# Patient Record
Sex: Female | Born: 1950 | Race: Black or African American | Hispanic: No | Marital: Single | Smoking: Former smoker
Health system: Southern US, Community
[De-identification: ages and names within clinical notes are randomized; demographics above are authoritative.]

## PROBLEM LIST (undated history)

## (undated) DIAGNOSIS — K259 Gastric ulcer, unspecified as acute or chronic, without hemorrhage or perforation: Secondary | ICD-10-CM

## (undated) DIAGNOSIS — D219 Benign neoplasm of connective and other soft tissue, unspecified: Secondary | ICD-10-CM

## (undated) DIAGNOSIS — K5790 Diverticulosis of intestine, part unspecified, without perforation or abscess without bleeding: Secondary | ICD-10-CM

## (undated) DIAGNOSIS — K219 Gastro-esophageal reflux disease without esophagitis: Secondary | ICD-10-CM

## (undated) DIAGNOSIS — K648 Other hemorrhoids: Secondary | ICD-10-CM

## (undated) DIAGNOSIS — IMO0001 Reserved for inherently not codable concepts without codable children: Secondary | ICD-10-CM

## (undated) DIAGNOSIS — E559 Vitamin D deficiency, unspecified: Secondary | ICD-10-CM

## (undated) DIAGNOSIS — R519 Headache, unspecified: Secondary | ICD-10-CM

## (undated) DIAGNOSIS — R51 Headache: Secondary | ICD-10-CM

## (undated) DIAGNOSIS — Z5189 Encounter for other specified aftercare: Secondary | ICD-10-CM

## (undated) HISTORY — DX: Gastric ulcer, unspecified as acute or chronic, without hemorrhage or perforation: K25.9

## (undated) HISTORY — DX: Vitamin D deficiency, unspecified: E55.9

## (undated) HISTORY — DX: Diverticulosis of intestine, part unspecified, without perforation or abscess without bleeding: K57.90

## (undated) HISTORY — DX: Encounter for other specified aftercare: Z51.89

## (undated) HISTORY — DX: Other hemorrhoids: K64.8

## (undated) HISTORY — PX: MYOMECTOMY: SHX85

## (undated) HISTORY — PX: UPPER GASTROINTESTINAL ENDOSCOPY: SHX188

## (undated) HISTORY — DX: Headache: R51

## (undated) HISTORY — DX: Reserved for inherently not codable concepts without codable children: IMO0001

## (undated) HISTORY — DX: Gastro-esophageal reflux disease without esophagitis: K21.9

## (undated) HISTORY — DX: Benign neoplasm of connective and other soft tissue, unspecified: D21.9

## (undated) HISTORY — PX: POLYPECTOMY: SHX149

## (undated) HISTORY — DX: Headache, unspecified: R51.9

## (undated) HISTORY — PX: ROTATOR CUFF REPAIR: SHX139

---

## 1997-07-25 ENCOUNTER — Ambulatory Visit (HOSPITAL_COMMUNITY): Admission: RE | Admit: 1997-07-25 | Discharge: 1997-07-25 | Payer: Self-pay | Admitting: Obstetrics and Gynecology

## 1997-10-18 ENCOUNTER — Emergency Department (HOSPITAL_COMMUNITY): Admission: EM | Admit: 1997-10-18 | Discharge: 1997-10-18 | Payer: Self-pay | Admitting: *Deleted

## 1998-03-30 ENCOUNTER — Emergency Department (HOSPITAL_COMMUNITY): Admission: EM | Admit: 1998-03-30 | Discharge: 1998-03-30 | Payer: Self-pay | Admitting: Emergency Medicine

## 1998-03-30 ENCOUNTER — Encounter: Payer: Self-pay | Admitting: Emergency Medicine

## 1998-07-06 ENCOUNTER — Other Ambulatory Visit: Admission: RE | Admit: 1998-07-06 | Discharge: 1998-07-06 | Payer: Self-pay | Admitting: Obstetrics and Gynecology

## 1999-08-28 ENCOUNTER — Encounter: Payer: Self-pay | Admitting: Family Medicine

## 1999-08-28 ENCOUNTER — Ambulatory Visit (HOSPITAL_COMMUNITY): Admission: RE | Admit: 1999-08-28 | Discharge: 1999-08-28 | Payer: Self-pay | Admitting: Family Medicine

## 1999-10-17 ENCOUNTER — Ambulatory Visit (HOSPITAL_COMMUNITY): Admission: RE | Admit: 1999-10-17 | Discharge: 1999-10-17 | Payer: Self-pay | Admitting: Family Medicine

## 1999-10-17 ENCOUNTER — Encounter: Payer: Self-pay | Admitting: Family Medicine

## 2000-04-23 ENCOUNTER — Other Ambulatory Visit: Admission: RE | Admit: 2000-04-23 | Discharge: 2000-04-23 | Payer: Self-pay | Admitting: Obstetrics and Gynecology

## 2001-06-17 ENCOUNTER — Other Ambulatory Visit: Admission: RE | Admit: 2001-06-17 | Discharge: 2001-06-17 | Payer: Self-pay | Admitting: Obstetrics and Gynecology

## 2001-09-21 ENCOUNTER — Encounter: Payer: Self-pay | Admitting: Family Medicine

## 2001-09-21 ENCOUNTER — Encounter: Admission: RE | Admit: 2001-09-21 | Discharge: 2001-09-21 | Payer: Self-pay | Admitting: Family Medicine

## 2002-07-14 ENCOUNTER — Other Ambulatory Visit: Admission: RE | Admit: 2002-07-14 | Discharge: 2002-07-14 | Payer: Self-pay | Admitting: Obstetrics and Gynecology

## 2002-12-22 ENCOUNTER — Encounter: Admission: RE | Admit: 2002-12-22 | Discharge: 2003-03-22 | Payer: Self-pay | Admitting: Family Medicine

## 2003-08-04 ENCOUNTER — Other Ambulatory Visit: Admission: RE | Admit: 2003-08-04 | Discharge: 2003-08-04 | Payer: Self-pay | Admitting: Obstetrics and Gynecology

## 2004-07-18 ENCOUNTER — Ambulatory Visit: Payer: Self-pay

## 2004-08-14 ENCOUNTER — Other Ambulatory Visit: Admission: RE | Admit: 2004-08-14 | Discharge: 2004-08-14 | Payer: Self-pay | Admitting: Obstetrics and Gynecology

## 2005-08-21 ENCOUNTER — Other Ambulatory Visit: Admission: RE | Admit: 2005-08-21 | Discharge: 2005-08-21 | Payer: Self-pay | Admitting: Obstetrics and Gynecology

## 2005-10-01 ENCOUNTER — Ambulatory Visit: Payer: Self-pay | Admitting: Gastroenterology

## 2005-10-17 ENCOUNTER — Ambulatory Visit: Payer: Self-pay | Admitting: Gastroenterology

## 2005-10-17 ENCOUNTER — Encounter (INDEPENDENT_AMBULATORY_CARE_PROVIDER_SITE_OTHER): Payer: Self-pay | Admitting: *Deleted

## 2006-10-07 ENCOUNTER — Encounter: Payer: Self-pay | Admitting: Internal Medicine

## 2006-10-07 ENCOUNTER — Inpatient Hospital Stay (HOSPITAL_COMMUNITY): Admission: EM | Admit: 2006-10-07 | Discharge: 2006-10-09 | Payer: Self-pay | Admitting: Emergency Medicine

## 2006-10-14 ENCOUNTER — Ambulatory Visit: Payer: Self-pay | Admitting: Internal Medicine

## 2006-11-14 ENCOUNTER — Ambulatory Visit: Payer: Self-pay | Admitting: Internal Medicine

## 2006-11-14 LAB — CONVERTED CEMR LAB
Basophils Absolute: 0 10*3/uL (ref 0.0–0.1)
Basophils Relative: 0 % (ref 0.0–1.0)
Eosinophils Absolute: 0.3 10*3/uL (ref 0.0–0.6)
Eosinophils Relative: 4.7 % (ref 0.0–5.0)
HCT: 31.9 % — ABNORMAL LOW (ref 36.0–46.0)
Hemoglobin: 11 g/dL — ABNORMAL LOW (ref 12.0–15.0)
Lymphocytes Relative: 26.6 % (ref 12.0–46.0)
MCHC: 34.4 g/dL (ref 30.0–36.0)
MCV: 88.3 fL (ref 78.0–100.0)
Monocytes Absolute: 0.4 10*3/uL (ref 0.2–0.7)
Monocytes Relative: 6.5 % (ref 3.0–11.0)
Neutro Abs: 4.3 10*3/uL (ref 1.4–7.7)
Neutrophils Relative %: 62.2 % (ref 43.0–77.0)
Platelets: 243 10*3/uL (ref 150–400)
RBC: 3.62 M/uL — ABNORMAL LOW (ref 3.87–5.11)
RDW: 15.3 % — ABNORMAL HIGH (ref 11.5–14.6)
WBC: 6.8 10*3/uL (ref 4.5–10.5)

## 2007-10-16 DIAGNOSIS — K219 Gastro-esophageal reflux disease without esophagitis: Secondary | ICD-10-CM | POA: Insufficient documentation

## 2007-10-16 DIAGNOSIS — K253 Acute gastric ulcer without hemorrhage or perforation: Secondary | ICD-10-CM | POA: Insufficient documentation

## 2007-10-16 DIAGNOSIS — D126 Benign neoplasm of colon, unspecified: Secondary | ICD-10-CM

## 2007-10-16 DIAGNOSIS — R000 Tachycardia, unspecified: Secondary | ICD-10-CM

## 2007-10-16 DIAGNOSIS — K5730 Diverticulosis of large intestine without perforation or abscess without bleeding: Secondary | ICD-10-CM | POA: Insufficient documentation

## 2007-10-16 DIAGNOSIS — D62 Acute posthemorrhagic anemia: Secondary | ICD-10-CM

## 2010-06-20 ENCOUNTER — Ambulatory Visit
Admission: RE | Admit: 2010-06-20 | Discharge: 2010-06-20 | Payer: Self-pay | Source: Home / Self Care | Attending: Family Medicine | Admitting: Family Medicine

## 2010-06-20 DIAGNOSIS — J309 Allergic rhinitis, unspecified: Secondary | ICD-10-CM | POA: Insufficient documentation

## 2010-06-24 ENCOUNTER — Telehealth: Payer: Self-pay | Admitting: Family Medicine

## 2010-07-05 NOTE — Progress Notes (Signed)
  Phone Note Call from Patient   Caller: Patient Reason for Call: Talk to Nurse Summary of Call: Patient came in on Wednesday to see Dr. Laural Benes.  Dr. Laural Benes prescribed the patient anitbiotic but the medication is upsetting her stomach.  She would like to know if the doctor could prescribe her something else.  You can call the patient at [phone]. Initial call taken by: Dorna Leitz,  June 24, 2010 1:45 PM  Follow-up for Phone Call        patient was precribed doxycycline Follow-up by: Adella Hare LPN,  June 24, 2010 1:54 PM  Additional Follow-up for Phone Call Additional follow up Details #1::        Please have patient to discontinue the doxycycline and will prescribe azithromycin ... See below.  Additional Follow-up by: Standley Dakins MD,  June 24, 2010 2:47 PM   New Allergies: ! ASA Additional Follow-up for Phone Call Additional follow up Details #2::    RETURNED CALL, LEFT MESSAGE Follow-up by: Adella Hare LPN,  June 24, 2010 3:39 PM  Additional Follow-up for Phone Call Additional follow up Details #3:: Details for Additional Follow-up Action Taken: patient aware and med sent to pharmacy patient requested Additional Follow-up by: Adella Hare LPN,  June 24, 2010 4:25 PM  New/Updated Medications: AZITHROMYCIN 250 MG TABS (AZITHROMYCIN) take 2 tabs by mouth on day 1, then 1 tab by mouth daily until completed New Allergies: ! ASAPrescriptions: AZITHROMYCIN 250 MG TABS (AZITHROMYCIN) take 2 tabs by mouth on day 1, then 1 tab by mouth daily until completed  #6 x 0   Entered by:   Adella Hare LPN   Authorized by:   Standley Dakins MD   Signed by:   Adella Hare LPN on 84/69/6295   Method used:   Electronically to        [facility]. [phone]* (retail)       [address]       [place], [place]  [address]       Ph: [phone]       Fax: [phone]   RxID:   [id number]

## 2010-07-05 NOTE — Assessment & Plan Note (Signed)
Summary: PAIN ON THE LEFT SIDE OF FACE AROUND EYE AND NOSE  (HX OF MIG...   Vital Signs:  Patient Profile:   60 Years Old Female CC:      Pain in the left side of Nose Height:     62 inches Weight:      180 pounds BMI:     33.04 O2 Sat:      96 % O2 treatment:    Room Air Temp:     97.3 degrees F oral Pulse rate:   75 / minute Pulse rhythm:   regular Resp:     18 per minute BP sitting:   123 / 79  (right arm)  Pt. in pain?   yes    Location:   head    Intensity:   8    Type:       aching  Vitals Entered By: Levonne Spiller EMT-P (June 20, 2010 11:53 AM)              Is Patient Diabetic? Yes   Does patient need assistance? Functional Status Self care Ambulation Normal      Current Allergies: ! * BENEDRYL ! * SHELL FISHHistory of Present Illness History from: patient Reason for visit: see chief complaint Chief Complaint: Pain in the left side of Nose History of Present Illness: This patient is presenting today because she reports that for the past several days she's been experiencing a worsening bad taste in the mouth. She reports that her symptoms started approximately 3 weeks ago and have not improved. She reports that her blood sugars have been higher in the last week. She's having sinus pain. She's also having exacerbation of ulcers in the mouth from poor dentition. The patient reports that she's having no fever but reports that she has been having allergy symptoms recently and does have a history of migraine headaches and pollen allergies. She denies having any nausea vomiting but reports that approximately 2 weeks ago she had bouts of hard stools and abdominal pain. The pain resolved when she was able to have a full bowel movement.  REVIEW OF SYSTEMS Constitutional Symptoms      Denies fever, chills, night sweats, weight loss, weight gain, and fatigue.  Eyes       Denies change in vision, eye pain, eye discharge, glasses, contact lenses, and eye  surgery. Ear/Nose/Throat/Mouth       Complains of frequent runny nose and sinus problems.      Denies hearing loss/aids, change in hearing, ear pain, ear discharge, dizziness, frequent nose bleeds, sore throat, hoarseness, and tooth pain or bleeding.  Respiratory       Denies dry cough, productive cough, wheezing, shortness of breath, asthma, bronchitis, and emphysema/COPD.  Cardiovascular       Denies murmurs, chest pain, and tires easily with exhertion.    Gastrointestinal       Denies stomach pain, nausea/vomiting, diarrhea, constipation, blood in bowel movements, and indigestion. Genitourniary       Denies painful urination, blood or discharge from vagina, kidney stones, and loss of urinary control. Neurological       Complains of headaches.      Denies paralysis, seizures, and fainting/blackouts. Musculoskeletal       Denies muscle pain, joint pain, joint stiffness, decreased range of motion, redness, swelling, muscle weakness, and gout.  Skin       Denies bruising, unusual mles/lumps or sores, and hair/skin or nail changes.  Psych  Denies mood changes, temper/anger issues, anxiety/stress, speech problems, depression, and sleep problems. Other Comments: Pt. complains of pain on the left side of her nose.   Past History:  Past Surgical History: Last updated: 10/16/2007 Myomectomy secondary to fibroids  Family History: Last updated: 06/20/2010 Both parents reported as deceased per patient. No other conditions reported.   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   Past Medical History: Current Problems:  ANEMIA, SECONDARY TO ACUTE BLOOD LOSS (ICD-285.1) Hx of TACHYCARDIA (ICD-785.0) ACID REFLUX DISEASE (ICD-530.81) ACUT GASTR ULCER W/O MENTION HEMORR PERF/OBST (ICD-531.30) Allergic rhinitis  Family History: Both parents reported as deceased per patient.  No other conditions reported.   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.  Physical Exam General appearance: well developed, well nourished, no acute distress Head: normocephalic, atraumatic Eyes: conjunctivae and lids normal Pupils: equal, round, reactive to light Ears: normal, no lesions or deformities Nasal: marked sinus and nasal congestion Oral/Pharynx: tongue normal, posterior pharynx without erythema or exudate, poor dentition and apthous ulcers on bridge of gums Neck: neck supple,  trachea midline, no masses Chest/Lungs: no rales, wheezes, or rhonchi bilateral, breath sounds equal without effort Heart: regular rate and  rhythm, no murmur Abdomen: soft, non-tender without obvious organomegaly Extremities: normal extremities Neurological: grossly intact and non-focal Skin: no obvious rashes or lesions MSE: oriented to time, place, and person Assessment  Assessed ALLERGIC RHINITIS as deteriorated - Standley Dakins MD New Problems: ACUTE SINUSITIS, UNSPECIFIED (ICD-461.9) APHTHOUS STOMATITIS (ICD-528.2) ALLERGIC RHINITIS (ICD-477.9)   Patient Education: Patient and/or caregiver instructed in the following: rest, fluids. The risks, benefits and possible side effects were clearly explained and discussed with the patient.  The patient verbalized clear understanding.  The patient was given instructions to return if symptoms don't improve, worsen or new changes develop.  If it is not during clinic hours and the patient cannot get back to this clinic then the patient was told to seek medical care at an available urgent care or emergency department.  The patient verbalized understanding.   Demonstrates willingness to comply.  Plan New Medications/Changes: LORATADINE 10 MG TABS (LORATADINE) take 1 by mouth daily for allergies  #14 x 0, 06/20/2010, Clanford Johnson MD LIDOCAINE VISCOUS 2 % SOLN  (LIDOCAINE HCL) 10 mL by mouth swish and spit out every 3 hours as needed for mouth ulcers; MAX 8 doses per day  #100 mL x 0, 06/20/2010, Clanford Johnson MD FLUTICASONE PROPIONATE 50 MCG/ACT SUSP (FLUTICASONE PROPIONATE) 2 sprays per nostril once daily  #1 x 0, 06/20/2010, Clanford Johnson MD DOXYCYCLINE HYCLATE 100 MG CAPS (DOXYCYCLINE HYCLATE) take 1 by mouth two times a day with food until completed  #20 x 0, 06/20/2010, Clanford Johnson MD  Planning Comments:   If no improvement, pt advised to call for ENT referral. The patient verbalized clear understanding. Pt was advised to follow up with dentist asap regarding apthous ulcers and will try using viscous lidocaine for symptoms as needed.  I advised the patient to be extra cautious with using viscous lidocaine and to be sure to avoid swallowing the medication. The patient verbalized understanding.   Follow Up: Follow up in 2-3 days if no improvement, Follow up on an as needed basis, Follow up with Primary Physician  The patient and/or caregiver has been counseled thoroughly with regard to medications prescribed  including dosage, schedule, interactions, rationale for use, and possible side effects and they verbalize understanding.  Diagnoses and expected course of recovery discussed and will return if not improved as expected or if the condition worsens. Patient and/or caregiver verbalized understanding.  Prescriptions: LORATADINE 10 MG TABS (LORATADINE) take 1 by mouth daily for allergies  #14 x 0   Entered and Authorized by:   Standley Dakins MD   Signed by:   Standley Dakins MD on 06/20/2010   Method used:   Electronically to        [facility] [address] (retail)       [address], [address]       [place], [place]  [address]       Ph: [phone]       Fax: [phone]   RxID:   [id number] LIDOCAINE VISCOUS 2 % SOLN (LIDOCAINE HCL) 10 mL by mouth swish and spit out every 3 hours as needed for  mouth ulcers; MAX 8 doses per day  #100 mL x 0   Entered and Authorized by:   Standley Dakins MD   Signed by:   Standley Dakins MD on 06/20/2010   Method used:   Electronically to        [facility] [address] (retail)       [address], [address]       [place], [place]  [address]       Ph: [phone]       Fax: [phone]   RxID:   [id number] FLUTICASONE PROPIONATE 50 MCG/ACT SUSP (FLUTICASONE PROPIONATE) 2 sprays per nostril once daily  #1 x 0   Entered and Authorized by:   Standley Dakins MD   Signed by:   Standley Dakins MD on 06/20/2010   Method used:   Electronically to        [facility] (retail)       [address], [address]       [place], [place]  [address]       Ph: [phone]       Fax: [phone]   RxID:   [id number] DOXYCYCLINE HYCLATE 100 MG CAPS (DOXYCYCLINE HYCLATE) take 1 by mouth two times a day with food until completed  #20 x 0   Entered and Authorized by:   Standley Dakins MD   Signed by:   Standley Dakins MD on 06/20/2010   Method used:   Electronically to        [facility] (retail)       [address], [address]       [place], [place]  [address]       Ph: [phone]       Fax: [phone]   RxID:   [id number]   Patient Instructions: 1)  Oral Rehydration Solution: drink 1/2 ounce every 15 minutes. If tolerated afert 1 hour, drink 1 ounce every 15 minutes. As you can tolerate, keep adding 1/2 ounce every 15 minutes, up to a total of 2-4 ounces. Contact the office if unable to tolerate oral solution, if you keep vomiting, or you continue to have signs of dehydration. 2)  Take your antibiotic as prescribed until ALL of it is gone, but stop if you develop a rash or swelling  and contact our office as soon as possible. 3)  Acute sinusitis symptoms for less than 10 days are not helped by antibiotics.Use warm moist  compresses, and over the counter decongestants ( only as directed). Call if no improvement in 5-7 days, sooner if increasing pain, fever, or new symptoms. 4)  Please see her dentist as soon as possible to have the abscess ulcers evaluated and to have dentures fitted. 5)  Return or go to the ER if no improvement or symptoms getting worse.   6)  The patient was informed that there is no on-call provider or services available at this clinic during off-hours (when the clinic is closed).  If the patient developed a problem or concern that required immediate attention, the patient was advised to go the the nearest available urgent care or emergency department for medical care.  The patient verbalized understanding.

## 2010-10-16 NOTE — Assessment & Plan Note (Signed)
[facility]                         GASTROENTEROLOGY OFFICE NOTE   ENNIFER, HARSTON                       MRN:          [id number]  DATE:11/14/2006                            DOB:          [birth year]    Ms. Bohnet is a 60 year old African-American female who was recently  hospitalized at [facility] with acute upper GI bleed, upper  endoscopy which on 10/07/06 showed active gastric ulcers in thegastric  antrum. It was H. pylori negative. The patient was treated with  intravenous proton pump inhibitor__________ and was discharged on  Protonix 40 mg twice daily. Shehas decreased it to once daily. She is  also on iron supplement and is feeling fine. She had some dark stools  due to taking oral iron, but denies abdominal pain, nausea, or vomiting.  She has discontinued the use of her non-steroidal agents. She is now  taking aspirin for headache without any improvement of her migraine  headaches.   PHYSICAL EXAMINATION:  VITAL SIGNS:  Blood pressure 118/80, pulse 72,  weight 201 pounds.  GENERAL:  She was moderately overweight, alert, and oriented.  LUNGS:  Clear to auscultation.  CARDIAC:  Normal S1, normal S2.  ABDOMEN:  Obese, soft and nontender, with normal active bowel sounds.  RECTAL:  Shows dark hemoccult negative stool.   IMPRESSION:  This is a 60 year old African-American female  with__________ status post upper GI bleed on 10/07/06. She is doing well  mostly due to discontinuation of her NSAIDs and due to taking her PPIs.   PLAN:  1. Continue Protonix 40 mg daily.  2. Re-endoscopy.  3. Continue to hold off on NSAIDs.  4. Instead of__________ try Fioricet every 4-6 hours p.r.n. headache.      Today, we will check her CBC.     Hedwig Morton. Juanda Chance, MD  Electronically Signed    DMB/MedQ  DD: 11/14/2006  DT: 11/15/2006  Job #: [id number]   cc:   Lacretia Leigh. Quintella Reichert, M.D.  Michaelyn Barter, M.D.

## 2010-10-19 NOTE — H&P (Signed)
NAME:  Jean Krause, Jean Krause NO.:  [id number]   MEDICAL RECORD NO.:  [id number]          PATIENT TYPE:  EMS   LOCATION:  ED                           FACILITY:  [facility]   PHYSICIAN:  Michaelyn Barter, M.D. DATE OF BIRTH:  September 23, 1950   DATE OF ADMISSION:  10/06/2006  DATE OF DISCHARGE:                              HISTORY & PHYSICAL   PRIMARY CARE PHYSICIAN:  Unassigned.   GASTROENTEROLOGIST:  [place].   CHIEF COMPLAINT:  Dark stools.   HISTORY OF PRESENT ILLNESS:  Jean Krause is a 60 year old female who  states that this past Saturday, she felt weak and dizzy, that is, only  four days ago.  She went to her urgent care medical center to be  evaluated.  She was told that her blood level was low and that her  hemoglobin was 9 point something.  Her stools were dark at that  particular time.  This was attributed to the Pepto-Bismol that she was  taking.  She was started on iron tablets; however, her symptoms did not  improve.  Today, she states that she experienced some shortness of  breath accompanied by tachycardia and diaphoresis.  As she walked the  short distance from the store to her car and then later, as she walked  carrying a bag of groceries from the car to the inside of her house.  She therefore came to the hospital for evaluation.  She states that she  felt severely weak over the past couple of days.  She has also had some  slight headaches but denies any nausea, vomiting, fevers, or chills.  She has never had any similar symptoms before.  She states that she has  been taking Excedrin for headache.   PAST MEDICAL HISTORY:  1. Acid reflux.  2. Headaches.  3. Colonoscopy done in 2007.  It was positive for polyps.  She sees      [facility].   PAST SURGICAL HISTORY:  Myomectomy secondary to fibroids.   ALLERGIES:  BENADRYL causes a rash.   CURRENT MEDICATIONS:  Patient is not sure.   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  Cigarettes:  Patient stopped smoking 20 years ago.  She  was 60 years old when she started.  Alcohol occasionally.   FAMILY HISTORY:  Mother:  No illnesses.  Father:  Cancer, type is  unknown.   REVIEW OF SYSTEMS:  As per HPI.   PHYSICAL EXAMINATION:  GENERAL:  The patient is awake, cooperative, no  obvious distress.  VITAL SIGNS:  Temperature 97, blood pressure 105/64, heart rate 107,  respirations 20, O2 sat 100% on room air.  HEENT:  Atraumatic.  Anicteric.  Extraocular movements are intact.  Pupils are equal and reactive to light.  Oral mucosa is pink.  No  thrush.  No exudates.  NECK:  Supple.  No lymphadenopathy.  Thyroid not palpable.  CARDIAC:  S1 and S2 present.  Regular rate and rhythm.  RESPIRATORY:  No crackles or wheezes.  ABDOMEN:  Soft, nontender, nondistended.  Positive bowel sounds.  No  masses.  EXTREMITIES:  No leg edema.  NEUROLOGIC:  Patient is alert and oriented x3.  MUSCULOSKELETAL:  Upper and lower extremity strength 5/5.   LABS:  White blood cell count 9.9, hemoglobin 6.8, hematocrit 20.2,  platelets 225, PT 14.2, INR 1.1, PTT 25.  Sodium 141, potassium 3.9,  chloride 108, CO2 25, BUN 26, creatinine 0.89, glucose 132, calcium 8.7.  CK-MB, TLC less than 1.  Troponin I, TLC less than 0.05.  Myoglobin, TLC  30.2.   ASSESSMENT/PLAN:  1. Symptomatic acute blood loss anemia:  The etiology of this may be      gastrointestinal-related, possibly upper gastrointestinal, given      the fact that the patient does complain of black stools, although      there is some questionable relationship to the patient's iron      and/or Pepto-Bismol that she was taking previously.  Will start the      patient on Protonix.  Will transfuse 3 units of packed red blood      cells.  Will monitor her H&H closely, and consult gastroenterology.      The patient is most likely to need an EGD and/or colonoscopy.  2. Tachycardia:  This is most likely related to #1.  Will  transfuse      the patient, check an EKG as well as cardiac enzymes x3 q.8h.      apart.      Michaelyn Barter, M.D.  Electronically Signed     OR/MEDQ  D:  10/07/2006  T:  10/07/2006  Job:  [id number]

## 2010-10-19 NOTE — Discharge Summary (Signed)
NAME:  Jean Krause, Jean Krause              ACCOUNT NO.:  [id number]   MEDICAL RECORD NO.:  [id number]          PATIENT TYPE:  INP   LOCATION:  1427                         FACILITY:  [facility]   PHYSICIAN:  Thomasenia Bottoms, MDDATE OF BIRTH:  [birth year]   DATE OF ADMISSION:  10/06/2006  DATE OF DISCHARGE:  10/09/2006                               DISCHARGE SUMMARY   Please see H&P dictated on Oct 07, 2006 for admission ETS.   BRIEF SUMMARY OF HOSPITAL COURSE:  [name] is a 60 year old woman,  who presented to the hospital with melena and symptomatic anemia.  She  was found to have a hemoglobin of 6.8 on arrival.  The patient was  transfused, her melena resolved.  She was seen in consultation by  [facility] gastroenterology.  She was found to have multiple small  prepyloric ulcers felt to be NSAID induced.  None were actively bleeding  and she did not require intervention.  The patient was placed on PPIs  and did very well during this hospital stay.  By the morning of  discharge, she is eating without difficulty.  Her hemoglobin on the  morning of discharge is 9.5 and has been stable there.  The patient was  taking NSAIDs for her occasional migraine headaches.  As an alternative,  she has been prescribed Egic which does not contain any NSAIDs.  I  personally discussed the issue of NSAIDs at length with her.  We also  discussed iron supplementation.  The patient actually had already gotten  a prescription for Niferex, which he will continue to take.   DISCHARGE DIAGNOSES:  1. Acute gastrointestinal bleed.  2. Multiple prepyloric and gastric ulcers by      esophagogastroduodenoscopy.  3. Posthemorrhagic anemia, now stable.  4. Migraine headaches.  5. She has a history of colonoscopy in 2007 by [facility] with polyps.  6. History of myomectomy secondary to fibroids.   MEDICATIONS ON DISCHARGE INCLUDE:  1. Protonix 40 mg one tablet b.i.d. for eight weeks and one tablet      every morning.  2. Esgic #50 one q.4 h. p.r.n. migraine headache.  3. Niferex 150 mg one tablet daily for iron deficiency.   FOLLOWUP INSTRUCTIONS:  The patient is to follow up with Dr. Juanda Chance, in  the office, on June 13 at 2 p.m. the patient currently does not have a  primary care physician; several primary doctors were listed with clinic  numbers in the patient is to follow up with one of them.      Thomasenia Bottoms, MD  Electronically Signed     CVC/MEDQ  D:  10/09/2006  T:  10/09/2006  Job:  [id number]

## 2010-11-06 ENCOUNTER — Other Ambulatory Visit: Payer: Self-pay | Admitting: Internal Medicine

## 2011-01-04 ENCOUNTER — Encounter: Payer: Self-pay | Admitting: Internal Medicine

## 2011-01-15 ENCOUNTER — Encounter: Payer: Self-pay | Admitting: Internal Medicine

## 2011-01-24 ENCOUNTER — Other Ambulatory Visit: Payer: Self-pay | Admitting: Internal Medicine

## 2011-02-06 ENCOUNTER — Ambulatory Visit (AMBULATORY_SURGERY_CENTER): Payer: Federal, State, Local not specified - PPO | Admitting: *Deleted

## 2011-02-06 ENCOUNTER — Encounter: Payer: Self-pay | Admitting: Internal Medicine

## 2011-02-06 VITALS — Ht 62.0 in | Wt 181.0 lb

## 2011-02-06 DIAGNOSIS — Z860100 Personal history of colon polyps, unspecified: Secondary | ICD-10-CM

## 2011-02-06 MED ORDER — SUPREP BOWEL PREP KIT 17.5-3.13-1.6 GM/177ML PO SOLN: 1.0000 | Freq: Once | ORAL | Status: DC

## 2011-02-06 MED ORDER — MOVIPREP 100 G PO SOLR: ORAL | Status: DC

## 2011-02-19 ENCOUNTER — Other Ambulatory Visit: Payer: Self-pay | Admitting: Internal Medicine

## 2011-02-22 ENCOUNTER — Telehealth: Payer: Self-pay | Admitting: Internal Medicine

## 2011-02-22 NOTE — Telephone Encounter (Signed)
CALLED PATIENT,NO ANSWER LEFT MESSAGE FOR PATIENT TO CALL us BACK.

## 2011-02-28 ENCOUNTER — Telehealth: Payer: Self-pay | Admitting: Internal Medicine

## 2011-02-28 NOTE — Telephone Encounter (Signed)
Offered pt rebate for MoviPrep.  Agrees to use MoviPrep. Jean Krause

## 2011-03-04 ENCOUNTER — Ambulatory Visit (AMBULATORY_SURGERY_CENTER): Payer: Federal, State, Local not specified - PPO | Admitting: Internal Medicine

## 2011-03-04 ENCOUNTER — Encounter: Payer: Self-pay | Admitting: Internal Medicine

## 2011-03-04 DIAGNOSIS — Z86010 Personal history of colonic polyps: Secondary | ICD-10-CM

## 2011-03-04 DIAGNOSIS — Z1211 Encounter for screening for malignant neoplasm of colon: Secondary | ICD-10-CM

## 2011-03-04 MED ORDER — SODIUM CHLORIDE 0.9 % IV SOLN: 500.0000 mL | INTRAVENOUS | Status: DC

## 2011-03-04 NOTE — Patient Instructions (Signed)
Discharge instructions given with verbal understanding. Handouts on diverticulosis and hemorrhoids given. Resume previous medications. 

## 2011-03-05 ENCOUNTER — Telehealth: Payer: Self-pay | Admitting: *Deleted

## 2011-03-05 NOTE — Telephone Encounter (Signed)
Voicemail no I.D. NO Message left.

## 2011-08-14 ENCOUNTER — Ambulatory Visit (INDEPENDENT_AMBULATORY_CARE_PROVIDER_SITE_OTHER): Payer: Federal, State, Local not specified - PPO | Admitting: Obstetrics and Gynecology

## 2011-08-14 DIAGNOSIS — Z01419 Encounter for gynecological examination (general) (routine) without abnormal findings: Secondary | ICD-10-CM

## 2011-11-15 ENCOUNTER — Ambulatory Visit (INDEPENDENT_AMBULATORY_CARE_PROVIDER_SITE_OTHER): Payer: Federal, State, Local not specified - PPO | Admitting: Family Medicine

## 2011-11-15 VITALS — BP 109/71 | HR 72 | Temp 98.2°F | Resp 16 | Ht 61.0 in | Wt 183.0 lb

## 2011-11-15 DIAGNOSIS — R1013 Epigastric pain: Secondary | ICD-10-CM

## 2011-11-15 DIAGNOSIS — K219 Gastro-esophageal reflux disease without esophagitis: Secondary | ICD-10-CM

## 2011-11-15 LAB — POCT CBC
HCT, POC: 36.1 % — AB (ref 37.7–47.9)
Hemoglobin: 11.6 g/dL — AB (ref 12.2–16.2)
Lymph, poc: 2.7 (ref 0.6–3.4)
MCH, POC: 29.3 pg (ref 27–31.2)
MCHC: 32.1 g/dL (ref 31.8–35.4)
MCV: 91.1 fL (ref 80–97)
MPV: 6.4 fL (ref 0–99.8)
POC MID %: 6.1 %M (ref 0–12)
WBC: 7.7 10*3/uL (ref 4.6–10.2)

## 2011-11-15 MED ORDER — GI COCKTAIL ~~LOC~~: 30.0000 mL | Freq: Once | ORAL | Status: DC

## 2011-11-15 MED ORDER — SUCRALFATE 1 G PO TABS: 1.0000 g | ORAL_TABLET | Freq: Four times a day (QID) | ORAL | Status: DC

## 2011-11-15 MED ORDER — PANTOPRAZOLE SODIUM 40 MG PO TBEC: 40.0000 mg | DELAYED_RELEASE_TABLET | Freq: Every day | ORAL | Status: DC

## 2011-11-15 NOTE — Progress Notes (Signed)
Patient Name: Jean Krause Date of Birth: 1950-08-31 Medical Record Number: [id number] Gender: female Date of Encounter: 11/15/2011  History of Present Illness:  Jean Krause is a 61 y.o. very pleasant female patient who presents with the following:  Here today with epigastric discomfort, feels "like something is stuck" in her esophagus."   Feels "gassy."  This has gone on for a couple of weeks but worse today.  She has a history of bleeding gastric ulcer in 2008, but has done well since.   She took a percocet very early today for the pain and for some back pain.  She also took an aspirin today due to concern about her heart- she typically avoids aspirin due to her stomach issues.   She has no history of heart problems.   Distant history of smoking.  No family history of CAD or heart problems.    She uses her protonix as needed but has not been taking this regularly.  She had a colonoscopy in 2012 which was normal, but no upper GI.   No blood in stools or melena.     The pain is not worsened by exertion, and she has no CP, nausea, or sweating associated.  No SOB  Patient Active Problem List  Diagnosis  . COLONIC POLYPS, HYPERPLASTIC  . ANEMIA, SECONDARY TO ACUTE BLOOD LOSS  . ACID REFLUX DISEASE  . ACUT GASTR ULCER W/O MENTION HEMORR PERF/OBST  . DIVERTICULOSIS, COLON  . TACHYCARDIA  . ALLERGIC RHINITIS   Past Medical History  Diagnosis Date  . Blood transfusion   . GERD (gastroesophageal reflux disease)   . Ulcer   . Stomach ulcer     2008   Past Surgical History  Procedure Date  . Polypectomy   . Upper gastrointestinal endoscopy   . Uterine fibroid removal    History  Substance Use Topics  . Smoking status: Former Smoker    Quit date: 06/07/1978  . Smokeless tobacco: Not on file  . Alcohol Use: No   No family history on file. Allergies  Allergen Reactions  . Aspirin   . Bactrim Nausea Only  . Diphenhydramine Hcl     REACTION: Hives  . Shellfish  Allergy     REACTION: Hives    Medication list has been reviewed and updated.  Prior to Admission medications   Medication Sig Start Date End Date Taking? Authorizing Provider  calcium carbonate 1250 MG capsule Take 1,250 mg by mouth daily.     Yes Historical Provider, MD  cholecalciferol (VITAMIN D) 1000 UNITS tablet Take 1,000 Units by mouth daily.     Yes Historical Provider, MD  Chromium 200 MCG CAPS Take by mouth daily.     Yes Historical Provider, MD  co-enzyme Q-10 30 MG capsule Take 30 mg by mouth daily.     Yes Historical Provider, MD  Multiple Vitamins-Minerals (MULTIVITAMIN WITH MINERALS) tablet Take 1 tablet by mouth daily.     Yes Historical Provider, MD  oxyCODONE-acetaminophen (PERCOCET) 10-325 MG per tablet Take 1 tablet by mouth as needed. For migraines.   Yes Historical Provider, MD  pantoprazole (PROTONIX) 40 MG tablet Take 40 mg by mouth daily.     Yes Historical Provider, MD   Review of Systems:  As per HPI- otherwise negative.  Physical Examination: Filed Vitals:   11/15/11 1319  BP: 109/71  Pulse: 72  Temp: 98.2 F (36.8 C)  Resp: 16   Filed Vitals:   11/15/11 1319  Height:  5\' 1"  (1.549 m)  Weight: 183 lb (83.008 kg)   Body mass index is 34.58 kg/(m^2). Ideal Body Weight: Weight in (lb) to have BMI = 25: 132   GEN: WDWN, NAD, Non-toxic, A & O x 3, obese HEENT: Atraumatic, Normocephalic. Neck supple. No masses, No LAD.  TM and oropharynx wnl, PEERL, EOMI Ears and Nose: No external deformity. CV: RRR, No M/G/R. No JVD. No thrill. No extra heart sounds. PULM: CTA B, no wheezes, crackles, rhonchi. No retractions. No resp. distress. No accessory muscle use. ABD: S, ND, +BS. No rebound. No HSM.  Mild epigastric tenderness, neg murphy's sign. I am able to reproduce the tenderness she has noted by pressing on her epigastric area.  Also able to reproduce tenderness on her right trunk by pressing on her ribs.  EXTR: No c/c/e NEURO Normal gait.  PSYCH:  Normally interactive. Conversant. Not depressed or anxious appearing.  Calm demeanor.   EKG:  Normal sinus rhythm, no ST elevation or depression.    Results for orders placed in visit on 11/15/11  POCT CBC      Component Value Range   WBC 7.7  4.6 - 10.2 K/uL   Lymph, poc 2.7  0.6 - 3.4   POC LYMPH PERCENT 35.2  10 - 50 %L   MID (cbc) 0.5  0 - 0.9   POC MID % 6.1  0 - 12 %M   POC Granulocyte 4.5  2 - 6.9   Granulocyte percent 58.7  37 - 80 %G   RBC 3.96 (*) 4.04 - 5.48 M/uL   Hemoglobin 11.6 (*) 12.2 - 16.2 g/dL   HCT, POC 08.6 (*) 57.8 - 47.9 %   MCV 91.1  80 - 97 fL   MCH, POC 29.3  27 - 31.2 pg   MCHC 32.1  31.8 - 35.4 g/dL   RDW, POC 46.9     Platelet Count, POC 308  142 - 424 K/uL   MPV 6.4  0 - 99.8 fL   Gi cocktail did provide considerable relief.    Assessment and Plan: 1. Epigastric pain  gi cocktail (Maalox,Lidocaine,Donnatal), EKG 12-Lead, POCT CBC  2. GERD (gastroesophageal reflux disease)  sucralfate (CARAFATE) 1 G tablet, pantoprazole (PROTONIX) 40 MG tablet   Suspect that Jean Krause is having GERD/ epigastric pain.  She will avoid acidic foods, take her protonix daily for a few weeks at least, and use carafate for a week or so.  If she is getting worse or having any other symptoms she will let us know or seek care right away.    Abbe Amsterdam, MD

## 2012-01-02 NOTE — Progress Notes (Signed)
Completed prior auth over the phone and received approval. Faxed approval notice to pharmacy.

## 2012-04-20 ENCOUNTER — Encounter: Payer: Self-pay | Admitting: Internal Medicine

## 2012-04-20 ENCOUNTER — Telehealth: Payer: Self-pay | Admitting: Internal Medicine

## 2012-04-20 NOTE — Telephone Encounter (Signed)
Received 8 pages from [facility]. Sent to Dr. Juanda Chance. 04/20/12/sd

## 2012-05-11 ENCOUNTER — Encounter: Payer: Self-pay | Admitting: *Deleted

## 2012-05-15 ENCOUNTER — Telehealth: Payer: Self-pay | Admitting: Obstetrics and Gynecology

## 2012-05-15 NOTE — Telephone Encounter (Signed)
Pt c/o Lump in Left Breast that is painful to touch. appt made 05-18-12 @ 3:45pm Pt agreeable  LC CMA

## 2012-05-18 ENCOUNTER — Encounter: Payer: Self-pay | Admitting: Obstetrics and Gynecology

## 2012-05-18 ENCOUNTER — Ambulatory Visit (INDEPENDENT_AMBULATORY_CARE_PROVIDER_SITE_OTHER): Payer: Federal, State, Local not specified - PPO | Admitting: Obstetrics and Gynecology

## 2012-05-18 VITALS — BP 104/62 | Ht 63.0 in | Wt 168.0 lb

## 2012-05-18 DIAGNOSIS — N644 Mastodynia: Secondary | ICD-10-CM

## 2012-05-18 DIAGNOSIS — K121 Other forms of stomatitis: Secondary | ICD-10-CM

## 2012-05-18 DIAGNOSIS — E669 Obesity, unspecified: Secondary | ICD-10-CM

## 2012-05-18 DIAGNOSIS — K1370 Unspecified lesions of oral mucosa: Secondary | ICD-10-CM

## 2012-05-18 NOTE — Progress Notes (Signed)
HISTORY OF PRESENT ILLNESS  [name] is a 61 y.o. year old female,G3P3, who presents for a problem visit. She complains of pain in her left breast.  She has been exercising.  She denies discharge and bleeding from her breast.  She has a history of diabetes. She has a history of recurrent mouth ulcers.  She is trying to lose weight.  Subjective:  See above.  Objective:  BP 104/62  Ht 5\' 3"  (1.6 m)  Wt 168 lb (76.204 kg)  BMI 29.76 kg/m2   Mouth: No ulcers present today General: no distress Resp: clear to auscultation bilaterally Cardio: regular rate and rhythm, S1, S2 normal, no murmur, click, rub or gallop breasts: No masses, discharge, or skin changes.  Exam deferred.  Assessment:  Breast pain  Mouth ulcers  Obesity  Plan:  The patient was offered a mammogram or breast ultrasound.  She agrees to observe for one more month and then reevaluate.  Mouth ulcers discussed.  She will try milk of magnesia to the ulcer the next number curved.  She was referred to her family physician.  Further patient to a nutritionist.  Return to office in 4 week(s).   Leonard Schwartz M.D.  05/18/2012 9:35 PM

## 2012-05-19 ENCOUNTER — Telehealth: Payer: Self-pay | Admitting: Obstetrics and Gynecology

## 2012-05-19 NOTE — Telephone Encounter (Signed)
Tc to pt regarding msg.  Pt states [facility] had given her or was to give her a rx but does not have it in the pharmacy.  Pt unsure what the rx is because she was not at home.  Pt advised to check to see what it is and to call this RN tomorrow.  Pt voices agreement.

## 2012-05-22 ENCOUNTER — Other Ambulatory Visit: Payer: Self-pay

## 2012-05-22 DIAGNOSIS — E669 Obesity, unspecified: Secondary | ICD-10-CM

## 2012-05-22 DIAGNOSIS — E119 Type 2 diabetes mellitus without complications: Secondary | ICD-10-CM

## 2012-05-25 ENCOUNTER — Telehealth: Payer: Self-pay

## 2012-05-25 NOTE — Telephone Encounter (Signed)
Referral ordered for Nut/diabetes. On 05-22-2012 Per [facility]  LC CMA

## 2012-06-12 ENCOUNTER — Ambulatory Visit: Payer: Federal, State, Local not specified - PPO | Admitting: Internal Medicine

## 2012-06-15 ENCOUNTER — Encounter: Payer: Federal, State, Local not specified - PPO | Admitting: Obstetrics and Gynecology

## 2012-06-15 ENCOUNTER — Ambulatory Visit (INDEPENDENT_AMBULATORY_CARE_PROVIDER_SITE_OTHER): Payer: Federal, State, Local not specified - PPO | Admitting: Obstetrics and Gynecology

## 2012-06-15 ENCOUNTER — Encounter: Payer: Self-pay | Admitting: Obstetrics and Gynecology

## 2012-06-15 VITALS — BP 110/70 | Ht 62.0 in | Wt 167.0 lb

## 2012-06-15 DIAGNOSIS — N644 Mastodynia: Secondary | ICD-10-CM

## 2012-06-15 DIAGNOSIS — K121 Other forms of stomatitis: Secondary | ICD-10-CM

## 2012-06-15 DIAGNOSIS — K1370 Unspecified lesions of oral mucosa: Secondary | ICD-10-CM

## 2012-06-15 NOTE — Progress Notes (Signed)
HISTORY OF PRESENT ILLNESS  [name] is a 62 y.o. year old female,G3P3, who presents for a problem visit. The patient was evaluated one month ago because of tenderness in her left breast.  No masses were detected on examination.  She had a normal mammogram last year.  She also complained of mouth soreness and ulcers.  She saw her dentist who gave her a mouthwash.  This did not help.  Subjective:  The tenderness in her left breast is better.  She continues to have mouth discomfort.  She has no lesions at this time.  They do return frequently.  Objective:  BP 110/70  Ht 5\' 2"  (1.575 m)  Wt 167 lb (75.751 kg)  BMI 30.54 kg/m2   General: alert, no distress and examination of her mouth shows some generalized hyperpigmentation and slight redness.  No lesions are appreciated. GI: nontender breasts: No masses, skin changes, or discharge present, no tenderness today on either side.  Exam deferred.  Assessment:  Improved breast tenderness.  Recurrent mouth discomfort and ulcers.  Plan:  Annual mammography.  Aphthous ulcers discussed.  The patient will followup with either an ENT specialist or her dentist.  Return to office in 1 month(s) for annual exam.   Leonard Schwartz M.D.  06/15/2012 6:23 PM

## 2012-07-16 ENCOUNTER — Encounter: Payer: Federal, State, Local not specified - PPO | Admitting: Obstetrics and Gynecology

## 2013-07-05 ENCOUNTER — Ambulatory Visit (INDEPENDENT_AMBULATORY_CARE_PROVIDER_SITE_OTHER): Payer: Federal, State, Local not specified - PPO | Admitting: Family Medicine

## 2013-07-05 VITALS — BP 120/80 | HR 64 | Temp 98.2°F | Resp 16 | Ht 61.5 in | Wt 169.0 lb

## 2013-07-05 DIAGNOSIS — G43109 Migraine with aura, not intractable, without status migrainosus: Secondary | ICD-10-CM

## 2013-07-05 MED ORDER — BUTALBITAL-APAP-CAFFEINE 50-325-40 MG PO TABS
1.0000 | ORAL_TABLET | Freq: Four times a day (QID) | ORAL | Status: DC | PRN
Start: 2013-07-05 — End: 2014-03-05

## 2013-07-05 MED ORDER — PROMETHAZINE HCL 25 MG PO TABS: 25.0000 mg | ORAL_TABLET | Freq: Three times a day (TID) | ORAL | Status: DC | PRN

## 2013-07-05 NOTE — Patient Instructions (Signed)
Migraine Headache A migraine headache is an intense, throbbing pain on one or both sides of your head. A migraine can last for 30 minutes to several hours. CAUSES  The exact cause of a migraine headache is not always known. However, a migraine may be caused when nerves in the brain become irritated and release chemicals that cause inflammation. This causes pain. Certain things may also trigger migraines, such as:  Alcohol.  Smoking.  Stress.  Menstruation.  Aged cheeses.  Foods or drinks that contain nitrates, glutamate, aspartame, or tyramine.  Lack of sleep.  Chocolate.  Caffeine.  Hunger.  Physical exertion.  Fatigue.  Medicines used to treat chest pain (nitroglycerine), birth control pills, estrogen, and some blood pressure medicines. SIGNS AND SYMPTOMS  Pain on one or both sides of your head.  Pulsating or throbbing pain.  Severe pain that prevents daily activities.  Pain that is aggravated by any physical activity.  Nausea, vomiting, or both.  Dizziness.  Pain with exposure to bright lights, loud noises, or activity.  General sensitivity to bright lights, loud noises, or smells. Before you get a migraine, you may get warning signs that a migraine is coming (aura). An aura may include:  Seeing flashing lights.  Seeing bright spots, halos, or zig-zag lines.  Having tunnel vision or blurred vision.  Having feelings of numbness or tingling.  Having trouble talking.  Having muscle weakness. DIAGNOSIS  A migraine headache is often diagnosed based on:  Symptoms.  Physical exam.  A CT scan or MRI of your head. These imaging tests cannot diagnose migraines, but they can help rule out other causes of headaches. TREATMENT Medicines may be given for pain and nausea. Medicines can also be given to help prevent recurrent migraines.  HOME CARE INSTRUCTIONS  Only take over-the-counter or prescription medicines for pain or discomfort as directed by your  health care provider. The use of long-term narcotics is not recommended.  Lie down in a dark, quiet room when you have a migraine.  Keep a journal to find out what may trigger your migraine headaches. For example, write down:  What you eat and drink.  How much sleep you get.  Any change to your diet or medicines.  Limit alcohol consumption.  Quit smoking if you smoke.  Get 7 9 hours of sleep, or as recommended by your health care provider.  Limit stress.  Keep lights dim if bright lights bother you and make your migraines worse. SEEK IMMEDIATE MEDICAL CARE IF:   Your migraine becomes severe.  You have a fever.  You have a stiff neck.  You have vision loss.  You have muscular weakness or loss of muscle control.  You start losing your balance or have trouble walking.  You feel faint or pass out.  You have severe symptoms that are different from your first symptoms. MAKE SURE YOU:   Understand these instructions.  Will watch your condition.  Will get help right away if you are not doing well or get worse. Document Released: 05/20/2005 Document Revised: 03/10/2013 Document Reviewed: 01/25/2013 [employer] Patient Information 2014 [employer].  

## 2013-07-05 NOTE — Progress Notes (Signed)
Chief Complaint:  Chief Complaint  Patient presents with  . Migraine    x 1 day    HPI: Jean Krause is a 63 y.o. female who is here for migraine, pain in right anterior part of face and back of neck, no recent traumas started yesterday. He has HAs from time to time. She had one really bad last week which was worse , she took so vinegar and water and felt better She has no asymmeteric weakness, no nause aor vomiting. No vision changes. No floaters, blurred vision or double vision. Light and noise bothers her. HAs maybe triggered by food but not sure which one. She had bbq chicken. Feels like similar HA  Denies CP, SOB, palpitations.  She tool oxycodone last night and did not do anygood. SHe can't take ASA/NSAIDs without relief.  She has light and noise sensitivity.  She doesnot work.  She has been eat and drink ok, has not eaten today. She did not take her pills today.   She used to go to [facility] [place] (2012) Had rx to [facility] (this gave her CP)From our old records she also got narcotics from [place] for HA but then this was dc due to possible rebound HAs.        Past Medical History  Diagnosis Date  . Blood transfusion   . GERD (gastroesophageal reflux disease)   . Diverticulosis   . Stomach ulcer     2008  . Internal hemorrhoids   . Fibroids   . Vitamin D deficiency   . Headache    Past Surgical History  Procedure Laterality Date  . Polypectomy    . Upper gastrointestinal endoscopy    . Myomectomy      with fibroid tumor excision  . Rotator cuff repair      right   History   Social History  . Marital Status: Single    Spouse Name: N/A    Number of Children: N/A  . Years of Education: N/A   Social History Main Topics  . Smoking status: Former Smoker    Quit date: 06/07/1978  . Smokeless tobacco: None  . Alcohol Use: No  . Drug Use: No  . Sexual Activity: Yes    Birth Control/ Protection: Post-menopausal   Other Topics Concern  .  None   Social History Narrative  . None   Family History  Problem Relation Age of Onset  . Arthritis Mother   . Heart disease    . Stroke    . Diabetes     Allergies  Allergen Reactions  . Aspirin   . Bactrim Nausea Only  . Diphenhydramine Hcl     REACTION: Hives  . Shellfish Allergy     REACTION: Hives   Prior to Admission medications   Medication Sig Start Date End Date Taking? Authorizing Provider  Berberine Chloride POWD by Does not apply route.   Yes Historical Provider, MD  calcium carbonate 1250 MG capsule Take 1,250 mg by mouth daily.     Yes Historical Provider, MD  cholecalciferol (VITAMIN D) 1000 UNITS tablet Take 1,000 Units by mouth daily.     Yes Historical Provider, MD  Chromium 200 MCG CAPS Take by mouth daily.     Yes Historical Provider, MD  CINNAMON PO Take by mouth.   Yes Historical Provider, MD  co-enzyme Q-10 30 MG capsule Take 30 mg by mouth daily.     Yes Historical Provider, MD  Multiple  Vitamins-Minerals (MULTIVITAMIN WITH MINERALS) tablet Take 1 tablet by mouth daily.     Yes Historical Provider, MD  oxyCODONE-acetaminophen (PERCOCET) 10-325 MG per tablet Take 1 tablet by mouth as needed. For migraines.   Yes Historical Provider, MD  pantoprazole (PROTONIX) 40 MG tablet Take 1 tablet (40 mg total) by mouth daily. 11/15/11  Yes Gay Filler Copland, MD  sucralfate (CARAFATE) 1 G tablet Take 1 tablet (1 g total) by mouth 4 (four) times daily. 11/15/11 11/14/12  Gay Filler Copland, MD     ROS: The patient denies fevers, chills, night sweats, unintentional weight loss, chest pain, palpitations, wheezing, dyspnea on exertion, nausea, vomiting, abdominal pain, dysuria, hematuria, melena, numbness, weakness, or tingling.  All other systems have been reviewed and were otherwise negative with the exception of those mentioned in the HPI and as above.    PHYSICAL EXAM: Filed Vitals:   07/05/13 1002  BP: 120/80  Pulse: 64  Temp: 98.2 F (36.8 C)  Resp: 16    Filed Vitals:   07/05/13 1002  Height: 5' 1.5" (1.562 m)  Weight: 169 lb (76.658 kg)   Body mass index is 31.42 kg/(m^2).  General: Alert, no acute distress HEENT:  Normocephalic, atraumatic, oropharynx patent. EOMI, PERRLA, nl TM, no exudates, fundscopic exam nl Cardiovascular:  Regular rate and rhythm, no rubs murmurs or gallops.  No Carotid bruits, radial pulse intact. No pedal edema.  Respiratory: Clear to auscultation bilaterally.  No wheezes, rales, or rhonchi.  No cyanosis, no use of accessory musculature GI: No organomegaly, abdomen is soft and non-tender, positive bowel sounds.  No masses. Skin: No rashes. Neurologic: Facial musculature symmetric. NO stroke sxs, CN 2-12 grossly normal Psychiatric: Patient is appropriate throughout our interaction. Lymphatic: No cervical lymphadenopathy Musculoskeletal: Gait intact. 5/5 strerngth, 2/2 DTRS, sensation intact   LABS: Results for orders placed in visit on 11/15/11  POCT CBC      Result Value Range   WBC 7.7  4.6 - 10.2 K/uL   Lymph, poc 2.7  0.6 - 3.4   POC LYMPH PERCENT 35.2  10 - 50 %L   MID (cbc) 0.5  0 - 0.9   POC MID % 6.1  0 - 12 %M   POC Granulocyte 4.5  2 - 6.9   Granulocyte percent 58.7  37 - 80 %G   RBC 3.96 (*) 4.04 - 5.48 M/uL   Hemoglobin 11.6 (*) 12.2 - 16.2 g/dL   HCT, POC 36.1 (*) 37.7 - 47.9 %   MCV 91.1  80 - 97 fL   MCH, POC 29.3  27 - 31.2 pg   MCHC 32.1  31.8 - 35.4 g/dL   RDW, POC 13.9     Platelet Count, POC 308  142 - 424 K/uL   MPV 6.4  0 - 99.8 fL     EKG/XRAY:   Primary read interpreted by Dr. Marin Comment at [facility].   ASSESSMENT/PLAN: Encounter Diagnosis  Name Primary?  . Migraine with aura Yes   No e/o stroke like sxs She will go to ER prn Declined IVF in office, drove herself so no promethazine IM  She has been on many meds including Zomig and also unable to take NSAIDs, so will just give fioricet and phenergan PO I reviewed her cahrt and we used to give her toradol but she now has a  NSAID intolerance.  Refer to neurology for HA, declines to go back to HA clinic F/u prn  Gross sideeffects, risk and benefits, and alternatives of  medications d/w patient. Patient is aware that all medications have potential sideeffects and we are unable to predict every sideeffect or drug-drug interaction that may occur.  LE, [place], DO 07/05/2013 11:04 AM

## 2014-03-05 ENCOUNTER — Emergency Department (HOSPITAL_COMMUNITY)
Admission: EM | Admit: 2014-03-05 | Discharge: 2014-03-05 | Disposition: A | Discharge status: Home / Self Care | Payer: Federal, State, Local not specified - PPO | Attending: Emergency Medicine | Admitting: Emergency Medicine

## 2014-03-05 ENCOUNTER — Encounter (HOSPITAL_COMMUNITY): Payer: Self-pay | Admitting: Emergency Medicine

## 2014-03-05 DIAGNOSIS — Z79899 Other long term (current) drug therapy: Secondary | ICD-10-CM | POA: Insufficient documentation

## 2014-03-05 DIAGNOSIS — Z87448 Personal history of other diseases of urinary system: Secondary | ICD-10-CM | POA: Insufficient documentation

## 2014-03-05 DIAGNOSIS — R42 Dizziness and giddiness: Secondary | ICD-10-CM | POA: Diagnosis present

## 2014-03-05 DIAGNOSIS — Z9889 Other specified postprocedural states: Secondary | ICD-10-CM | POA: Diagnosis not present

## 2014-03-05 DIAGNOSIS — K219 Gastro-esophageal reflux disease without esophagitis: Secondary | ICD-10-CM | POA: Insufficient documentation

## 2014-03-05 DIAGNOSIS — R079 Chest pain, unspecified: Secondary | ICD-10-CM | POA: Diagnosis not present

## 2014-03-05 DIAGNOSIS — K259 Gastric ulcer, unspecified as acute or chronic, without hemorrhage or perforation: Secondary | ICD-10-CM | POA: Insufficient documentation

## 2014-03-05 DIAGNOSIS — E559 Vitamin D deficiency, unspecified: Secondary | ICD-10-CM | POA: Insufficient documentation

## 2014-03-05 DIAGNOSIS — Z792 Long term (current) use of antibiotics: Secondary | ICD-10-CM | POA: Insufficient documentation

## 2014-03-05 DIAGNOSIS — Z87891 Personal history of nicotine dependence: Secondary | ICD-10-CM | POA: Diagnosis not present

## 2014-03-05 LAB — I-STAT CHEM 8, ED
BUN: 11 mg/dL (ref 6–23)
CREATININE: 1 mg/dL (ref 0.50–1.10)
Calcium, Ion: 1.23 mmol/L (ref 1.13–1.30)
Chloride: 105 mEq/L (ref 96–112)
Glucose, Bld: 90 mg/dL (ref 70–99)
HCT: 39 % (ref 36.0–46.0)
Hemoglobin: 13.3 g/dL (ref 12.0–15.0)
POTASSIUM: 4.2 meq/L (ref 3.7–5.3)
SODIUM: 142 meq/L (ref 137–147)
TCO2: 26 mmol/L (ref 0–100)

## 2014-03-05 LAB — CBG MONITORING, ED: Glucose-Capillary: 85 mg/dL (ref 70–99)

## 2014-03-05 MED ORDER — MECLIZINE HCL 25 MG PO TABS: 25.0000 mg | ORAL_TABLET | Freq: Three times a day (TID) | ORAL | Status: DC | PRN

## 2014-03-05 NOTE — Discharge Instructions (Signed)
Vertigo Vertigo means you feel like you or your surroundings are moving when they are not. Vertigo can be dangerous if it occurs when you are at work, driving, or performing difficult activities.  CAUSES  Vertigo occurs when there is a conflict of signals sent to your brain from the visual and sensory systems in your body. There are many different causes of vertigo, including:  Infections, especially in the inner ear.  A bad reaction to a drug or misuse of alcohol and medicines.  Withdrawal from drugs or alcohol.  Rapidly changing positions, such as lying down or rolling over in bed.  A migraine headache.  Decreased blood flow to the brain.  Increased pressure in the brain from a head injury, infection, tumor, or bleeding. SYMPTOMS  You may feel as though the world is spinning around or you are falling to the ground. Because your balance is upset, vertigo can cause nausea and vomiting. You may have involuntary eye movements (nystagmus). DIAGNOSIS  Vertigo is usually diagnosed by physical exam. If the cause of your vertigo is unknown, your caregiver may perform imaging tests, such as an MRI scan (magnetic resonance imaging). TREATMENT  Most cases of vertigo resolve on their own, without treatment. Depending on the cause, your caregiver may prescribe certain medicines. If your vertigo is related to body position issues, your caregiver may recommend movements or procedures to correct the problem. In rare cases, if your vertigo is caused by certain inner ear problems, you may need surgery. HOME CARE INSTRUCTIONS   Follow your caregiver's instructions.  Avoid driving.  Avoid operating heavy machinery.  Avoid performing any tasks that would be dangerous to you or others during a vertigo episode.  Tell your caregiver if you notice that certain medicines seem to be causing your vertigo. Some of the medicines used to treat vertigo episodes can actually make them worse in some people. SEEK  IMMEDIATE MEDICAL CARE IF:   Your medicines do not relieve your vertigo or are making it worse.  You develop problems with talking, walking, weakness, or using your arms, hands, or legs.  You develop severe headaches.  Your nausea or vomiting continues or gets worse.  You develop visual changes.  A family member notices behavioral changes.  Your condition gets worse. MAKE SURE YOU:  Understand these instructions.  Will watch your condition.  Will get help right away if you are not doing well or get worse. Document Released: 02/27/2005 Document Revised: 08/12/2011 Document Reviewed: 12/06/2010 [facility] Patient Information 2015 [place], [place]. This information is not intended to replace advice given to you by your health care provider. Make sure you discuss any questions you have with your health care provider.  Epley Maneuver Self-Care WHAT IS THE EPLEY MANEUVER? The Epley maneuver is an exercise you can do to relieve symptoms of benign paroxysmal positional vertigo (BPPV). This condition is often just referred to as vertigo. BPPV is caused by the movement of tiny crystals (canaliths) inside your inner ear. The accumulation and movement of canaliths in your inner ear causes a sudden spinning sensation (vertigo) when you move your head to certain positions. Vertigo usually lasts about 30 seconds. BPPV usually occurs in just one ear. If you get vertigo when you lie on your left side, you probably have BPPV in your left ear. Your health care provider can tell you which ear is involved.  BPPV may be caused by a head injury. Many people older than 50 get BPPV for unknown reasons. If you have  been diagnosed with BPPV, your health care provider may teach you how to do this maneuver. BPPV is not life threatening (benign) and usually goes away in time.  WHEN SHOULD I PERFORM THE EPLEY MANEUVER? You can do this maneuver at home whenever you have symptoms of vertigo. You may do the Epley maneuver  up to 3 times a day until your symptoms of vertigo go away. HOW SHOULD I DO THE EPLEY MANEUVER? 1. Sit on the edge of a bed or table with your back straight. Your legs should be extended or hanging over the edge of the bed or table.  2. Turn your head halfway toward the affected ear.  3. Lie backward quickly with your head turned until you are lying flat on your back. You may want to position a pillow under your shoulders.  4. Hold this position for 30 seconds. You may experience an attack of vertigo. This is normal. Hold this position until the vertigo stops. 5. Then turn your head to the opposite direction until your unaffected ear is facing the floor.  6. Hold this position for 30 seconds. You may experience an attack of vertigo. This is normal. Hold this position until the vertigo stops. 7. Now turn your whole body to the same side as your head. Hold for another 30 seconds.  8. You can then sit back up. ARE THERE RISKS TO THIS MANEUVER? In some cases, you may have other symptoms (such as changes in your vision, weakness, or numbness). If you have these symptoms, stop doing the maneuver and call your health care provider. Even if doing these maneuvers relieves your vertigo, you may still have dizziness. Dizziness is the sensation of light-headedness but without the sensation of movement. Even though the Epley maneuver may relieve your vertigo, it is possible that your symptoms will return within 5 years. WHAT SHOULD I DO AFTER THIS MANEUVER? After doing the Epley maneuver, you can return to your normal activities. Ask your doctor if there is anything you should do at home to prevent vertigo. This may include:  Sleeping with two or more pillows to keep your head elevated.  Not sleeping on the side of your affected ear.  Getting up slowly from bed.  Avoiding sudden movements during the day.  Avoiding extreme head movement, like looking up or bending over.  Wearing a cervical collar to  prevent sudden head movements. WHAT SHOULD I DO IF MY SYMPTOMS GET WORSE? Call your health care provider if your vertigo gets worse. Call your provider right way if you have other symptoms, including:   Nausea.  Vomiting.  Headache.  Weakness.  Numbness.  Vision changes. Document Released: 05/25/2013 Document Reviewed: 05/25/2013 [facility] Patient Information 2015 [place], [place]. This information is not intended to replace advice given to you by your health care provider. Make sure you discuss any questions you have with your health care provider.

## 2014-03-05 NOTE — ED Notes (Addendum)
Pt presents with c/o dizziness. Pt reports that she had some dental work last week and has been taking an antibiotic and hydrocodone. Pt reports that earlier this week she woke up from sleep and the room was spinning when she woke up, pt reports the same thing happened last night and today as well. Pt reports the sensation happens when she is laying down, unsure of whether it is related to the medication she is taking. Pt denies any chest pain, N/V/D at this time. Pt also reports that she is "pre-diabetic".

## 2014-03-05 NOTE — ED Provider Notes (Signed)
CSN: [id number]     Arrival date & time 03/05/14  1650 History   First MD Initiated Contact with Patient 03/05/14 1659     Chief Complaint  Patient presents with  . Dizziness     (Consider location/radiation/quality/duration/timing/severity/associated sxs/prior Treatment) HPI Comments: Patient presents for dizziness. She states that yesterday morning when she was in bed she turned over and had a onset of a spinning sensation. It only lasted for a couple minutes and then when she got up it resolved. She states she had another episode later in the day when she was bending over to get a bone charger. It again only lasted a short time and then resolve. She had one other episode today associated with movement as well. She states she just wanted to come in and get checked out. She still feels a little bit woozy but denies any spinning sensation currently. She has chronic ongoing headaches and states that these have not changed or worsened over last few days. She denies any current headache. She denies any speech deficits or vision changes. She denies any numbness or weakness to her extremities. She denies any balance deficits. She denies any neck pain or fevers. She denies any cough or chest congestion. She had one episode of discomfort in the left side of her chest 2-3 days ago. She states only lasted for a minute or 2 and she felt like it was due to gas. She's had no recurrent episodes. She denies any shortness of breath. She recently had some dental work done for which she just finished a course of amoxicillin and hydrocodone. She states she has not taken hydrocodone about 3 days. She has a followup appointment with her dentist in 2 days.  Patient is a 63 y.o. female presenting with dizziness.  Dizziness Associated symptoms: chest pain   Associated symptoms: no blood in stool, no diarrhea, no headaches, no nausea, no shortness of breath and no vomiting     Past Medical History  Diagnosis Date  .  Blood transfusion   . GERD (gastroesophageal reflux disease)   . Diverticulosis   . Stomach ulcer     2008  . Internal hemorrhoids   . Fibroids   . Vitamin D deficiency   . Headache    Past Surgical History  Procedure Laterality Date  . Polypectomy    . Upper gastrointestinal endoscopy    . Myomectomy      with fibroid tumor excision  . Rotator cuff repair      right   Family History  Problem Relation Age of Onset  . Arthritis Mother   . Heart disease    . Stroke    . Diabetes     History  Substance Use Topics  . Smoking status: Former Smoker    Quit date: 06/07/1978  . Smokeless tobacco: Not on file  . Alcohol Use: No   OB History   Grav Para Term Preterm Abortions TAB SAB Ect Mult Living   3 3        3      Review of Systems  Constitutional: Negative for fever, chills, diaphoresis and fatigue.  HENT: Negative for congestion, rhinorrhea and sneezing.   Eyes: Negative.   Respiratory: Negative for cough, chest tightness and shortness of breath.   Cardiovascular: Positive for chest pain. Negative for leg swelling.  Gastrointestinal: Negative for nausea, vomiting, abdominal pain, diarrhea and blood in stool.  Genitourinary: Negative for frequency, hematuria, flank pain and difficulty urinating.  Musculoskeletal: Negative  for arthralgias and back pain.  Skin: Negative for rash.  Neurological: Positive for dizziness. Negative for speech difficulty, weakness, numbness and headaches.      Allergies  Aspirin; Bactrim; Diphenhydramine hcl; and Shellfish allergy  Home Medications   Prior to Admission medications   Medication Sig Start Date End Date Taking? Authorizing Provider  amoxicillin (AMOXIL) 500 MG capsule Take 500 mg by mouth 3 (three) times daily.  02/24/14  Yes Historical Provider, MD  Barberry-Oreg Grape-Goldenseal (BERBERINE COMPLEX) 200-200-50 MG CAPS Take 1 capsule by mouth 3 (three) times daily.   Yes Historical Provider, MD  calcium carbonate 1250 MG  capsule Take 1,250 mg by mouth daily.    Yes Historical Provider, MD  cholecalciferol (VITAMIN D) 1000 UNITS tablet Take 5,000 Units by mouth daily.    Yes Historical Provider, MD  CINNAMON PO Take 1 capsule by mouth daily.    Yes Historical Provider, MD  OVER THE COUNTER MEDICATION Take 1 Package by mouth daily. Multivitamin pack   Yes Historical Provider, MD  meclizine (ANTIVERT) 25 MG tablet Take 1 tablet (25 mg total) by mouth 3 (three) times daily as needed for dizziness. 03/05/14   Malvin Johns, MD  sucralfate (CARAFATE) 1 G tablet Take 1 tablet (1 g total) by mouth 4 (four) times daily. 11/15/11 11/14/12  Gay Filler Copland, MD   BP 135/80  Pulse 67  Temp(Src) 98 F (36.7 C) (Oral)  Resp 14  SpO2 99% Physical Exam  Constitutional: She is oriented to person, place, and time. She appears well-developed and well-nourished.  HENT:  Head: Normocephalic and atraumatic.  Eyes: Pupils are equal, round, and reactive to light.  Neck: Normal range of motion. Neck supple.  Cardiovascular: Normal rate, regular rhythm and normal heart sounds.   Pulmonary/Chest: Effort normal and breath sounds normal. No respiratory distress. She has no wheezes. She has no rales. She exhibits no tenderness.  Abdominal: Soft. Bowel sounds are normal. There is no tenderness. There is no rebound and no guarding.  Musculoskeletal: Normal range of motion. She exhibits no edema.  Lymphadenopathy:    She has no cervical adenopathy.  Neurological: She is alert and oriented to person, place, and time.  Motor 5 out of 5 all extremities. Gait normal. Sensation grossly intact to light touch all extremities. No pronator drift. Finger to nose intact. Cranial nerves II through XII grossly intact.  Skin: Skin is warm and dry. No rash noted.  Psychiatric: She has a normal mood and affect.    ED Course  Procedures (including critical care time) Labs Review Labs Reviewed  CBG MONITORING, ED  I-STAT CHEM 8, ED    Imaging  Review No results found.   EKG Interpretation   Date/Time:  Saturday March 05 2014 17:47:23 EDT Ventricular Rate:  58 PR Interval:  165 QRS Duration: 89 QT Interval:  389 QTC Calculation: 382 R Axis:   68 Text Interpretation:  Sinus rhythm Low voltage, precordial leads since  last tracing no significant change Confirmed by Tamaria Dunleavy  MD, Ephram Kornegay ([id number])  on 03/05/2014 5:54:48 PM      MDM   Final diagnoses:  Vertigo    Patient symptoms are consistent with vertigo. She has no ongoing symptoms. She has no symptoms that would be more concerning for cerebellar stroke. She currently denies any symptoms. Her hemoglobin is normal. She had one episode of some chest discomfort to 3 days ago but has had no ongoing chest discomfort or shortness of breath. Her EKG shows no ischemic  changes. She was discharged in good condition. She was encouraged to followup with her primary care physician if her symptoms continue. She was given a prescription for meclizine to use as needed for ongoing dizziness.    Malvin Johns, MD 03/05/14 [phone]

## 2014-04-04 ENCOUNTER — Encounter (HOSPITAL_COMMUNITY): Payer: Self-pay | Admitting: Emergency Medicine

## 2014-12-06 ENCOUNTER — Ambulatory Visit (INDEPENDENT_AMBULATORY_CARE_PROVIDER_SITE_OTHER): Payer: Federal, State, Local not specified - PPO | Admitting: Family Medicine

## 2014-12-06 ENCOUNTER — Ambulatory Visit (INDEPENDENT_AMBULATORY_CARE_PROVIDER_SITE_OTHER): Payer: Federal, State, Local not specified - PPO

## 2014-12-06 VITALS — BP 120/80 | HR 71 | Temp 98.2°F | Resp 18 | Ht 61.25 in | Wt 172.5 lb

## 2014-12-06 DIAGNOSIS — G43109 Migraine with aura, not intractable, without status migrainosus: Secondary | ICD-10-CM | POA: Diagnosis not present

## 2014-12-06 DIAGNOSIS — D649 Anemia, unspecified: Secondary | ICD-10-CM

## 2014-12-06 DIAGNOSIS — M542 Cervicalgia: Secondary | ICD-10-CM

## 2014-12-06 DIAGNOSIS — T148XXA Other injury of unspecified body region, initial encounter: Secondary | ICD-10-CM

## 2014-12-06 LAB — POCT CBC
Granulocyte percent: 47.2 % (ref 37–80)
HCT, POC: 36.4 % — AB (ref 37.7–47.9)
Hemoglobin: 11.1 g/dL — AB (ref 12.2–16.2)
Lymph, poc: 2.6 (ref 0.6–3.4)
MCH, POC: 27.8 pg (ref 27–31.2)
MCHC: 30.4 g/dL — AB (ref 31.8–35.4)
MCV: 91.5 fL (ref 80–97)
MID (cbc): 0.5 (ref 0–0.9)
MPV: 6.4 fL (ref 0–99.8)
POC Granulocyte: 2.7 (ref 2–6.9)
POC LYMPH PERCENT: 44.2 % (ref 10–50)
POC MID %: 8.6 %M (ref 0–12)
Platelet Count, POC: 322 10*3/uL (ref 142–424)
RBC: 3.98 M/uL — AB (ref 4.04–5.48)
RDW, POC: 16.6 %
WBC: 5.8 10*3/uL (ref 4.6–10.2)

## 2014-12-06 MED ORDER — DIAZEPAM 5 MG PO TABS: 5.0000 mg | ORAL_TABLET | Freq: Two times a day (BID) | ORAL | Status: DC | PRN

## 2014-12-06 NOTE — Patient Instructions (Signed)
Diazepam tablets What is this medicine? DIAZEPAM (dye AZ e pam) is a benzodiazepine. It is used to treat anxiety and nervousness. It also can help treat alcohol withdrawal, relax muscles, and treat certain types of seizures. This medicine may be used for other purposes; ask your health care provider or pharmacist if you have questions. COMMON BRAND NAME(S): Valium What should I tell my health care provider before I take this medicine? They need to know if you have any of these conditions -an alcohol or drug abuse problem -bipolar disorder, depression, psychosis or other mental health condition -glaucoma -kidney or liver disease -lung or breathing disease -myasthenia gravis -Parkinson's disease -seizures or a history of seizures -suicidal thoughts -an unusual or allergic reaction to diazepam, other benzodiazepines, foods, dyes, or preservatives -pregnant or trying to get pregnant -breast-feeding How should I use this medicine? Take this medicine by mouth with a glass of water. Follow the directions on the prescription label. If this medicine upsets your stomach, take it with food or milk. Take your doses at regular intervals. Do not take your medicine more often than directed. If you have been taking this medicine regularly for some time, do not suddenly stop taking it. You must gradually reduce the dose or you may get severe side effects. Ask your doctor or health care professional for advice. Even after you stop taking this medicine it can still affect your body for several days. Talk to your pediatrician regarding the use of this medicine in children. Special care may be needed. Overdosage: If you think you have taken too much of this medicine contact a poison control center or emergency room at once. NOTE: This medicine is only for you. Do not share this medicine with others. What if I miss a dose? If you miss a dose, take it as soon as you can. If it is almost time for your next dose,  take only that dose. Do not take double or extra doses. What may interact with this medicine? -cimetidine -grapefruit juice -herbal or dietary supplements like kava kava, melatonin, St. John's Wort, or valerian -medicines for anxiety or sleeping problems, like alprazolam, lorazepam, or triazolam -medicines for depression, mental problems or psychiatric disturbances -medicines for HIV infection or AIDS -prescription pain medicines -rifampin, rifapentine, or rifabutin -some medicines for seizures like carbamazepine, phenobarbital, phenytoin, or primidone This list may not describe all possible interactions. Give your health care provider a list of all the medicines, herbs, non-prescription drugs, or dietary supplements you use. Also tell them if you smoke, drink alcohol, or use illegal drugs. Some items may interact with your medicine. What should I watch for while using this medicine? Visit your doctor or health care professional for regular checks on your progress. Your body can become dependent on this medicine. Ask your doctor or health care professional if you still need to take it. You may get drowsy or dizzy. Do not drive, use machinery, or do anything that needs mental alertness until you know how this medicine affects you. To reduce the risk of dizzy and fainting spells, do not stand or sit up quickly, especially if you are an older patient. Alcohol may increase dizziness and drowsiness. Avoid alcoholic drinks. Do not treat yourself for coughs, colds or allergies without asking your doctor or health care professional for advice. Some ingredients can increase possible side effects. What side effects may I notice from receiving this medicine? Side effects that you should report to your doctor or health care professional as soon  as possible: -allergic reactions like skin rash, itching or hives, swelling of the face, lips, or tongue -angry, confused, depressed, other mood changes -breathing  problems -feeling faint or lightheaded, falls -muscle cramps -problems with balance, talking, walking -restlessness -tremors -trouble passing urine or change in the amount of urine -unusually weak or tired Side effects that usually do not require medical attention (report to your doctor or health care professional if they continue or are bothersome): -difficulty sleeping, nightmares -dizziness, drowsiness, clumsiness, or unsteadiness, a hangover effect -headache -nausea, vomiting This list may not describe all possible side effects. Call your doctor for medical advice about side effects. You may report side effects to FDA at 1-800-FDA-1088. Where should I keep my medicine? Keep out of the reach of children. This medicine can be abused. Keep your medicine in a safe place to protect it from theft. Do not share this medicine with anyone. Selling or giving away this medicine is dangerous and against the law. Store at room temperature between 15 and 30 degrees C (59 and 86 degrees F). Protect from light. Keep container tightly closed. Throw away any unused medicine after the expiration date. NOTE: This sheet is a summary. It may not cover all possible information. If you have questions about this medicine, talk to your doctor, pharmacist, or health care provider.  2015, [employer]. (2007-09-07 16:57:35)

## 2014-12-06 NOTE — Progress Notes (Signed)
Chief Complaint:  Chief Complaint  Patient presents with  . Leg Pain    left leg pain radiuating up to hip since yesterday  . Back Pain    Upper back pain started this morning-both elbows hurt also. Worse with certain positions  . Headache    H/O migraines    HPI: Jean Krause is a 64 y.o. female who is here for  acute onset of headache starting this morning. She also has upper and back and also neck pain which she never typically has with her headaches. She has some pain in her legs bilaterally as well. This started several days ago. She usually has headaches triggered by certain foods. She's been to a headache specialist many many times. She's been on different medicines without relief. She has some light and noise sensitivity. She also has some upper back pain which started this morning and she has diffuse aches and pains in her arms bilaterally. She has had no new activities. No known trauma that she is aware of. There is no numbness weakness or tingling. No vision loss and fusion. No asymmetric weakness or sent symptoms of stroke. Denies any nausea, vomiting, chest pain, palpitations, abdominal pain, rashes or diarrhea.  She has a history of irritation and burning in her lips. She's been to an allergist, dermatologist, her nose and throat doctor, and also a dentist. And she still has this problem. They've given her solids and prednisone without relief.   Prior office  Visit---see below.  Jean Krause is a 64 y.o. female who is here for migraine, pain in right anterior part of face and back of neck, no recent traumas started yesterday. He has HAs from time to time. She had one really bad last week which was worse , she took so vinegar and water and felt better She has no asymmeteric weakness, no nause aor vomiting. No vision changes. No floaters, blurred vision or double vision. Light and noise bothers her. HAs maybe triggered by food but not sure which one. She had bbq  chicken. Feels like similar HA  Denies CP, SOB, palpitations.  She tool oxycodone last night and did not do anygood. SHe can't take ASA/NSAIDs without relief.  She has light and noise sensitivity.  She doesnot work.  She has been eat and drink ok, has not eaten today. She did not take her pills today.   She used to go to [facility] ([facility]) Had rx to im[facility]rex (this gave her CP)From our old records she also got narcotics from [place] for HA but then this was dc due to possible rebound HAs.   Past Medical History  Diagnosis Date  . Blood transfusion   . GERD (gastroesophageal reflux disease)   . Diverticulosis   . Stomach ulcer     2008  . Internal hemorrhoids   . Fibroids   . Vitamin D deficiency   . Headache    Past Surgical History  Procedure Laterality Date  . Polypectomy    . Upper gastrointestinal endoscopy    . Myomectomy      with fibroid tumor excision  . Rotator cuff repair      right   History   Social History  . Marital Status: Single    Spouse Name: N/A  . Number of Children: N/A  . Years of Education: N/A   Social History Main Topics  . Smoking status: Former Smoker    Quit date: 06/07/1978  . Smokeless tobacco:  Not on file  . Alcohol Use: No  . Drug Use: No  . Sexual Activity: Yes    Birth Control/ Protection: Post-menopausal   Other Topics Concern  . None   Social History Narrative   Family History  Problem Relation Age of Onset  . Arthritis Mother   . Heart disease    . Stroke    . Diabetes     Allergies  Allergen Reactions  . Aspirin Other (See Comments)    Upset stomach and bleeding  . Bactrim Nausea Only  . Diphenhydramine Hcl     REACTION: Hives  . Shellfish Allergy     REACTION: Hives   Prior to Admission medications   Medication Sig Start Date End Date Taking? Authorizing Provider  Barberry-Oreg Grape-Goldenseal (BERBERINE COMPLEX) 200-200-50 MG CAPS Take 1 capsule by mouth 3 (three) times daily.   Yes  Historical Provider, MD  calcium carbonate 1250 MG capsule Take 1,250 mg by mouth daily.    Yes Historical Provider, MD  cholecalciferol (VITAMIN D) 1000 UNITS tablet Take 5,000 Units by mouth daily.    Yes Historical Provider, MD  CINNAMON PO Take 1 capsule by mouth daily.    Yes Historical Provider, MD  meclizine (ANTIVERT) 25 MG tablet Take 1 tablet (25 mg total) by mouth 3 (three) times daily as needed for dizziness. 03/05/14  Yes Malvin Johns, MD  OVER THE COUNTER MEDICATION Take 1 Package by mouth daily. Multivitamin pack   Yes Historical Provider, MD     ROS: The patient denies fevers, chills, night sweats, unintentional weight loss, chest pain, palpitations, wheezing, dyspnea on exertion, nausea, vomiting, abdominal pain, dysuria, hematuria, melena, numbness, weakness, or tingling. All other systems have been reviewed and were otherwise negative with the exception of those mentioned in the HPI and as above.    PHYSICAL EXAM: Filed Vitals:   12/06/14 1947  BP: 120/80  Pulse: 71  Temp: 98.2 F (36.8 C)  Resp: 18   Filed Vitals:   12/06/14 1947  Height: 5' 1.25" (1.556 m)  Weight: 172 lb 8 oz (78.245 kg)   Body mass index is 32.32 kg/(m^2).   General: Alert, no acute distress HEENT:  Normocephalic, atraumatic, oropharynx patent. EOMI, PERRLA Cardiovascular:  Regular rate and rhythm, no rubs murmurs or gallops.  No Carotid bruits, radial pulse intact. No pedal edema.  Respiratory: Clear to auscultation bilaterally.  No wheezes, rales, or rhonchi.  No cyanosis, no use of accessory musculature GI: No organomegaly, abdomen is soft and non-tender, positive bowel sounds.  No masses. Skin: No rashes. Neurologic: Facial musculature symmetric. Psychiatric: Patient is appropriate throughout our interaction. Lymphatic: No cervical lymphadenopathy Musculoskeletal: Gait intact. c spine normal, neg spurling shoudler exam normal bialterally + paramsk tenderness  Full ROM 5/5 strength,  2/2 DTRs No saddle anesthesia Straight leg negative Hip and knee exam--normal   LABS: Results for orders placed or performed in visit on 12/06/14  POCT CBC  Result Value Ref Range   WBC 5.8 4.6 - 10.2 K/uL   Lymph, poc 2.6 0.6 - 3.4   POC LYMPH PERCENT 44.2 10 - 50 %L   MID (cbc) 0.5 0 - 0.9   POC MID % 8.6 0 - 12 %M   POC Granulocyte 2.7 2 - 6.9   Granulocyte percent 47.2 37 - 80 %G   RBC 3.98 (A) 4.04 - 5.48 M/uL   Hemoglobin 11.1 (A) 12.2 - 16.2 g/dL   HCT, POC 36.4 (A) 37.7 - 47.9 %  MCV 91.5 80 - 97 fL   MCH, POC 27.8 27 - 31.2 pg   MCHC 30.4 (A) 31.8 - 35.4 g/dL   RDW, POC 16.6 %   Platelet Count, POC 322 142 - 424 K/uL   MPV 6.4 0 - 99.8 fL     EKG/XRAY:   Primary read interpreted by Dr. Marin Comment at [facility]. Neg for fracture or dislocation + DJD   ASSESSMENT/PLAN: Encounter Diagnoses  Name Primary?  . Migraine with aura and without status migrainosus, not intractable Yes  . Neck pain   . Sprain and strain   . Anemia, unspecified anemia type    Declines any IV fluids she is unable to take any anti-inflammatory meds so Toradol is not in option Valium given for HA, She will push fluids at home She will me know if this is the worst headache of her life or she if she has worsening symptoms, she will get a CT scan of the case to or be sent to the ER.  Precautions given  She has a history of fibroids status post myomectomy, diverticulosis with up-to-date colonoscopy in 2012, internal hemorrhoids. She does not have any symptoms of melena or hematochezia or hematuria. She does have a history of anemia and she thinks it might be from an iron deficiency. Iron studies pending.    Gross sideeffects, risk and benefits, and alternatives of medications d/w patient. Patient is aware that all medications have potential sideeffects and we are unable to predict every sideeffect or drug-drug interaction that may occur.  LE, Wanette, DO 12/06/2014 9:05 PM

## 2014-12-07 LAB — COMPLETE METABOLIC PANEL WITH GFR
ALT: 15 U/L (ref 0–35)
Albumin: 4 g/dL (ref 3.5–5.2)
BUN: 12 mg/dL (ref 6–23)
CO2: 29 mEq/L (ref 19–32)
Calcium: 9.7 mg/dL (ref 8.4–10.5)
Chloride: 104 mEq/L (ref 96–112)
GFR, Est African American: 71 mL/min
GFR, Est Non African American: 62 mL/min
Glucose, Bld: 82 mg/dL (ref 70–99)
Potassium: 4.6 mEq/L (ref 3.5–5.3)
Sodium: 139 mEq/L (ref 135–145)
Total Protein: 6.7 g/dL (ref 6.0–8.3)

## 2014-12-07 LAB — IBC PANEL
%SAT: 36 % (ref 20–55)
TIBC: 315 ug/dL (ref 250–470)
UIBC: 203 ug/dL (ref 125–400)

## 2014-12-07 LAB — FERRITIN: Ferritin: 13 ng/mL (ref 10–291)

## 2014-12-07 LAB — COMPLETE METABOLIC PANEL WITHOUT GFR
AST: 15 U/L (ref 0–37)
Alkaline Phosphatase: 43 U/L (ref 39–117)
Creat: 0.97 mg/dL (ref 0.50–1.10)
Total Bilirubin: 0.4 mg/dL (ref 0.2–1.2)

## 2014-12-07 LAB — IRON: Iron: 112 ug/dL (ref 42–145)

## 2014-12-13 ENCOUNTER — Telehealth: Payer: Self-pay | Admitting: Family Medicine

## 2014-12-13 NOTE — Telephone Encounter (Signed)
Spoke to patient about labs, iron studies are low end of normal, hgb is 11.1 from 13.3 9 months ago, she is UTD on colonscopy from 2012 which showed diverticulosis and also internal hemorrhoids, she needs anemia workup or at least a recheck of her cbc. She states she will get this done at her PCPs office. She went to see her PCP sicne HA was still present and upper back still hurting her , got what sound slike a toradol injection without any SEs, provided some releif but still has symptoms. Advise to return prn

## 2015-02-02 ENCOUNTER — Encounter: Payer: Self-pay | Admitting: Family Medicine

## 2015-07-25 ENCOUNTER — Ambulatory Visit (INDEPENDENT_AMBULATORY_CARE_PROVIDER_SITE_OTHER): Payer: Federal, State, Local not specified - PPO | Admitting: Physician Assistant

## 2015-07-25 VITALS — BP 104/72 | HR 70 | Temp 98.4°F | Resp 16 | Ht 62.0 in | Wt 181.0 lb

## 2015-07-25 DIAGNOSIS — J069 Acute upper respiratory infection, unspecified: Secondary | ICD-10-CM

## 2015-07-25 DIAGNOSIS — B9789 Other viral agents as the cause of diseases classified elsewhere: Principal | ICD-10-CM

## 2015-07-25 MED ORDER — DOXYCYCLINE HYCLATE 100 MG PO TABS: 100.0000 mg | ORAL_TABLET | Freq: Two times a day (BID) | ORAL | Status: DC

## 2015-07-25 MED ORDER — HYDROCODONE-HOMATROPINE 5-1.5 MG/5ML PO SYRP: 5.0000 mL | ORAL_SOLUTION | Freq: Three times a day (TID) | ORAL | Status: DC | PRN

## 2015-07-25 MED ORDER — GUAIFENESIN ER 1200 MG PO TB12: 1.0000 | ORAL_TABLET | Freq: Two times a day (BID) | ORAL | Status: AC

## 2015-07-25 NOTE — Progress Notes (Signed)
   Jean Krause  MRN: [id number] DOB: [birth year]  Subjective:  Pt presents to clinic with 10 day h/o cold symptoms.  She has had the cough since the beginning and she started mucinex about 5 days ago and today she noticed that the sputum changed from white to green.  She woke up this am with one of her normal migraines.  She is worried because she is flying to [place] tomorrow and is not sure she should do.   Home treatment - dayquil and mucinex   Patient Active Problem List   Diagnosis Date Noted  . ALLERGIC RHINITIS 06/20/2010  . 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 ANEMIA, SECONDARY TO ACUTE BLOOD LOSS 10/16/2007  . ACID REFLUX DISEASE 10/16/2007  . ACUT GASTR ULCER W/O MENTION HEMORR PERF/OBST 10/16/2007  . DIVERTICULOSIS, COLON 10/16/2007  . TACHYCARDIA 10/16/2007    Current Outpatient Prescriptions on File Prior to Visit  Medication Sig Dispense Refill  . Barberry-Oreg Grape-Goldenseal (BERBERINE COMPLEX) 200-200-50 MG CAPS Take 1 capsule by mouth 3 (three) times daily.    . calcium carbonate 1250 MG capsule Take 1,250 mg by mouth daily.     . cholecalciferol (VITAMIN D) 1000 UNITS tablet Take 5,000 Units by mouth daily.     Marland Kitchen CINNAMON PO Take 1 capsule by mouth daily.     Marland Kitchen OVER THE COUNTER MEDICATION Take 1 Package by mouth daily. Multivitamin pack     No current facility-administered medications on file prior to visit.    Allergies  Allergen Reactions  . Aspirin Other (See Comments)    Upset stomach and bleeding  . Bactrim Nausea Only  . Diphenhydramine Hcl     REACTION: Hives  . Shellfish Allergy     REACTION: Hives    Review of Systems  Constitutional: Negative for fever and chills.  HENT: Negative for congestion and sore throat.   Respiratory: Positive for cough.        H/o childhood asthma, nonsmoker  Neurological: Positive for headaches.   Objective:  BP 104/72 mmHg  Pulse 70  Temp(Src) 98.4 F (36.9 C) (Oral)  Resp 16  Ht 5\' 2"  (1.575 m)  Wt  181 lb (82.101 kg)  BMI 33.10 kg/m2  SpO2 98%  Physical Exam  Constitutional: She is oriented to person, place, and time and well-developed, well-nourished, and in no distress.  HENT:  Head: Normocephalic and atraumatic.  Right Ear: Hearing, tympanic membrane, external ear and ear canal normal.  Left Ear: Hearing, tympanic membrane, external ear and ear canal normal.  Nose: Nose normal.  Mouth/Throat: Uvula is midline, oropharynx is clear and moist and mucous membranes are normal.  Eyes: Conjunctivae are normal.  Neck: Normal range of motion.  Cardiovascular: Normal rate, regular rhythm and normal heart sounds.   No murmur heard. Pulmonary/Chest: Effort normal and breath sounds normal. She has no wheezes.  Neurological: She is alert and oriented to person, place, and time. Gait normal.  Skin: Skin is warm and dry.  Psychiatric: Mood, memory, affect and judgment normal.  Vitals reviewed.   Assessment and Plan :  Viral URI with cough - Plan: HYDROcodone-homatropine (HYCODAN) 5-1.5 MG/5ML syrup, doxycycline (VIBRA-TABS) 100 MG tablet, Care order/instruction, Guaifenesin (MUCINEX MAXIMUM STRENGTH) 1200 MG TB12   Symptomatic care d/w pt.  Windell Hummingbird PA-C  [facility] 07/25/2015 1:42 PM

## 2015-07-25 NOTE — Patient Instructions (Signed)
Please push fluids.  Tylenol and Motrin for fever and body aches.    A humidifier can help especially when the air is dry -if you do not have a humidifier you can boil a pot of water on the stove in your home to help with the dry air.

## 2015-08-08 ENCOUNTER — Encounter (HOSPITAL_COMMUNITY): Payer: Self-pay

## 2015-08-08 ENCOUNTER — Emergency Department (HOSPITAL_COMMUNITY): Payer: Federal, State, Local not specified - PPO

## 2015-08-08 ENCOUNTER — Emergency Department (HOSPITAL_COMMUNITY)
Admission: EM | Admit: 2015-08-08 | Discharge: 2015-08-08 | Disposition: A | Discharge status: Home / Self Care | Payer: Federal, State, Local not specified - PPO | Attending: Emergency Medicine | Admitting: Emergency Medicine

## 2015-08-08 DIAGNOSIS — K219 Gastro-esophageal reflux disease without esophagitis: Secondary | ICD-10-CM | POA: Insufficient documentation

## 2015-08-08 DIAGNOSIS — I517 Cardiomegaly: Secondary | ICD-10-CM | POA: Diagnosis not present

## 2015-08-08 DIAGNOSIS — R197 Diarrhea, unspecified: Secondary | ICD-10-CM | POA: Insufficient documentation

## 2015-08-08 DIAGNOSIS — Z79899 Other long term (current) drug therapy: Secondary | ICD-10-CM | POA: Insufficient documentation

## 2015-08-08 DIAGNOSIS — Z86018 Personal history of other benign neoplasm: Secondary | ICD-10-CM | POA: Diagnosis not present

## 2015-08-08 DIAGNOSIS — Z792 Long term (current) use of antibiotics: Secondary | ICD-10-CM | POA: Insufficient documentation

## 2015-08-08 DIAGNOSIS — Z87891 Personal history of nicotine dependence: Secondary | ICD-10-CM | POA: Insufficient documentation

## 2015-08-08 DIAGNOSIS — R059 Cough, unspecified: Secondary | ICD-10-CM

## 2015-08-08 DIAGNOSIS — E559 Vitamin D deficiency, unspecified: Secondary | ICD-10-CM | POA: Insufficient documentation

## 2015-08-08 DIAGNOSIS — R05 Cough: Secondary | ICD-10-CM | POA: Diagnosis present

## 2015-08-08 LAB — BASIC METABOLIC PANEL
ANION GAP: 5 (ref 5–15)
BUN: 11 mg/dL (ref 6–20)
CO2: 29 mmol/L (ref 22–32)
Calcium: 9 mg/dL (ref 8.9–10.3)
Chloride: 107 mmol/L (ref 101–111)
Creatinine, Ser: 0.96 mg/dL (ref 0.44–1.00)
GFR calc Af Amer: >60 mL/min (ref >60)
GFR calc non Af Amer: >60 mL/min (ref >60)
Glucose, Bld: 102 mg/dL — ABNORMAL HIGH (ref 65–99)
POTASSIUM: 4.3 mmol/L (ref 3.5–5.1)
Sodium: 141 mmol/L (ref 135–145)

## 2015-08-08 LAB — CBC WITH DIFFERENTIAL/PLATELET
Basophils Absolute: 0 10*3/uL (ref 0.0–0.1)
Basophils Relative: 0 %
Eosinophils Absolute: 0.3 10*3/uL (ref 0.0–0.7)
Eosinophils Relative: 3 %
HEMATOCRIT: 35.2 % — AB (ref 36.0–46.0)
Hemoglobin: 11.5 g/dL — ABNORMAL LOW (ref 12.0–15.0)
LYMPHS ABS: 4.5 10*3/uL — AB (ref 0.7–4.0)
LYMPHS PCT: 48 %
MCH: 29.8 pg (ref 26.0–34.0)
MCHC: 32.7 g/dL (ref 30.0–36.0)
MCV: 91.2 fL (ref 78.0–100.0)
MONO ABS: 0.5 10*3/uL (ref 0.1–1.0)
MONOS PCT: 5 %
NEUTROS ABS: 4.1 10*3/uL (ref 1.7–7.7)
Neutrophils Relative %: 44 %
Platelets: 284 10*3/uL (ref 150–400)
RBC: 3.86 MIL/uL — ABNORMAL LOW (ref 3.87–5.11)
RDW: 14.2 % (ref 11.5–15.5)
WBC: 9.4 10*3/uL (ref 4.0–10.5)

## 2015-08-08 MED ORDER — BENZONATATE 100 MG PO CAPS: 100.0000 mg | ORAL_CAPSULE | Freq: Three times a day (TID) | ORAL | Status: DC

## 2015-08-08 NOTE — ED Provider Notes (Signed)
CSN: [id number]     Arrival date & time 08/08/15  1353 History   By signing my name below, I, Forrestine Him, attest that this documentation has been prepared under the direction and in the presence of Mauriana Dann PA-C.  Electronically Signed: Forrestine Him, ED Scribe. 08/08/2015. 4:39 PM.   Chief Complaint  Patient presents with  . Cough   The history is provided by the patient. No language interpreter was used.    HPI Comments: Jean Krause is a 65 y.o. female who presents to the Emergency Department complaining of constant, ongoing, mostly dry cough with intermittent clear mucous x 3 weeks. Pt also reports some mild nasal congestion. Denies purulent nasal discharge. No aggravating or alleviating factors at this time. Pt attempted OTC Mucinex, doxycycline, Prednisone x 5 days, and inhaler x 6 days from previous visits at an Urgent Care without any long term noticeable improvement. She does note that she previously had chest congestion which has resolved. She denies fever, chills, headache, dizziness, syncope, ear pain, eye redness, rhinorrhea, sore throat, neck pain, chest pain, SOB, palpitations, abdominal pain, nausea, vomiting, or myalgias. She is not a smoker. She denies personal or family cardiac history.   PCP: Maximino Greenland, MD    Past Medical History  Diagnosis Date  . Blood transfusion   . GERD (gastroesophageal reflux disease)   . Diverticulosis   . Stomach ulcer     2008  . Internal hemorrhoids   . Fibroids   . Vitamin D deficiency   . Headache    Past Surgical History  Procedure Laterality Date  . Polypectomy    . Upper gastrointestinal endoscopy    . Myomectomy      with fibroid tumor excision  . Rotator cuff repair      right   Family History  Problem Relation Age of Onset  . Arthritis Mother   . Heart disease    . Stroke    . Diabetes     Social History  Substance Use Topics  . Smoking status: Former Smoker    Quit date: 06/07/1978  . Smokeless  tobacco: None  . Alcohol Use: No   OB History    Gravida Para Term Preterm AB TAB SAB Ectopic Multiple Living   3 3        3      Review of Systems  HENT: Positive for congestion.   Respiratory: Positive for cough.   All other systems reviewed and are negative.     Allergies  Aspirin; Bactrim; Diphenhydramine hcl; and Shellfish allergy  Home Medications   Prior to Admission medications   Medication Sig Start Date End Date Taking? Authorizing Provider  Barberry-Oreg Grape-Goldenseal (BERBERINE COMPLEX) 200-200-50 MG CAPS Take 1 capsule by mouth 3 (three) times daily.    Historical Provider, MD  benzonatate (TESSALON) 100 MG capsule Take 1 capsule (100 mg total) by mouth every 8 (eight) hours. 08/08/15   Belvin Gauss, PA-C  calcium carbonate 1250 MG capsule Take 1,250 mg by mouth daily.     Historical Provider, MD  cholecalciferol (VITAMIN D) 1000 UNITS tablet Take 5,000 Units by mouth daily.     Historical Provider, MD  CINNAMON PO Take 1 capsule by mouth daily.     Historical Provider, MD  doxycycline (VIBRA-TABS) 100 MG tablet Take 1 tablet (100 mg total) by mouth 2 (two) times daily. 07/25/15   Mancel Bale, PA-C  HYDROcodone-homatropine (HYCODAN) 5-1.5 MG/5ML syrup Take 5 mLs by mouth every 8 (eight)  hours as needed for cough. 07/25/15   Mancel Bale, PA-C  OVER THE COUNTER MEDICATION Take 1 Package by mouth daily. Multivitamin pack    Historical Provider, MD   Triage Vitals: BP 143/85 mmHg  Pulse 71  Temp(Src) 98.3 F (36.8 C) (Oral)  Resp 18  SpO2 97%   Physical Exam  Constitutional: She appears well-developed and well-nourished. No distress.  HENT:  Head: Normocephalic and atraumatic.  Right Ear: Tympanic membrane and ear canal normal.  Left Ear: Tympanic membrane and ear canal normal.  Nose: Nose normal. Right sinus exhibits no maxillary sinus tenderness and no frontal sinus tenderness. Left sinus exhibits no maxillary sinus tenderness and no frontal sinus  tenderness.  Mouth/Throat: Oropharynx is clear and moist. No oropharyngeal exudate.  Eyes: Conjunctivae are normal. Right eye exhibits no discharge. Left eye exhibits no discharge. No scleral icterus.  Neck: Normal range of motion.  Cardiovascular: Normal rate, regular rhythm and normal heart sounds.   No pitting peripheral edema  Pulmonary/Chest: Effort normal and breath sounds normal. No respiratory distress. She has no wheezes. She has no rales.  Musculoskeletal: Normal range of motion.  Neurological: She is alert. Coordination normal.  Skin: Skin is warm and dry.  Psychiatric: She has a normal mood and affect. Her behavior is normal.  Nursing note and vitals reviewed.   ED Course  Procedures (including critical care time)  DIAGNOSTIC STUDIES: Oxygen Saturation is 97% on RA, adequate by my interpretation.    COORDINATION OF CARE: 4:33 PM-Discussed treatment plan with pt at bedside and pt agreed to plan.     Labs Review Labs Reviewed  CBC WITH DIFFERENTIAL/PLATELET - Abnormal; Notable for the following:    RBC 3.86 (*)    Hemoglobin 11.5 (*)    HCT 35.2 (*)    Lymphs Abs 4.5 (*)    All other components within normal limits  BASIC METABOLIC PANEL - Abnormal; Notable for the following:    Glucose, Bld 102 (*)    All other components within normal limits    Imaging Review Dg Chest 2 View  08/08/2015  CLINICAL DATA:  Cough for 3 weeks EXAM: CHEST  2 VIEW COMPARISON:  10/06/2006 FINDINGS: Mild cardiomegaly. Tiny pleural effusions. Low volumes. Bronchitic changes are chronic. No pneumothorax. IMPRESSION: Cardiomegaly without decompensation. Tiny pleural effusions. Electronically Signed   By: Marybelle Killings M.D.   On: 08/08/2015 15:06   I have personally reviewed and evaluated these images and lab results as part of my medical decision-making.   EKG Interpretation   Date/Time:  Tuesday August 08 2015 17:50:25 EST Ventricular Rate:  68 PR Interval:  138 QRS Duration: 78 QT  Interval:  374 QTC Calculation: 397 R Axis:   55 Text Interpretation:  Normal sinus rhythm Low voltage QRS Borderline ECG  Confirmed by Hazle Coca [phone]) on 08/08/2015 5:58:16 PM      MDM   Final diagnoses:  Cough  Cardiomegaly   65 year old female presenting with cough and nasal congestion 3 weeks. Evaluated by urgent care and given doxycycline, prednisone and albuterol inhaler without improvement in her symptoms. He is hemodynamically stable and in no acute distress. No sinus tenderness to palpation. No nasal congestion noted. Oropharynx without erythema or exudate. Lungs clear to auscultation bilaterally. No peripheral edema. Chest x-ray shows mild cardiomegaly with tiny pleural effusions. Compared to chest x-ray from 2008, cardiomegaly is a new finding. She denies history of cardiomegaly or cardiac disease. He has never followed up with cardiologist. Discussed case with  attending Dr. Ralene Bathe who recommends basic lab work and EKG.  6:00 - CBC with hemoglobin of 11.5 which is her baseline. No impaired renal function. EKG with normal sinus rhythm. Patient will follow up with cardiology outpatient for evaluation of her cardiomegaly. Symptoms consistent with viral upper respiratory infection. Discussed symptomatic care and will discharge with Tessalon Perles. I discussed reasons to return immediately to the emergency department. Patient is hemodynamically stable in no acute distress prior to discharge.  I personally performed the services described in this documentation, which was scribed in my presence. The recorded information has been reviewed and is accurate.   Josephina Gip, PA-C 08/08/15 1825  Quintella Reichert, MD 08/11/15 1028

## 2015-08-08 NOTE — ED Notes (Signed)
Pt has had cough x 3 weeks.  Has been treated with meds, steroids, antibiotics, albuterol inhaler, etc.  Symptoms not improving.  No fever.

## 2015-08-08 NOTE — Discharge Instructions (Signed)
Schedule a follow-up appointment with a cardiologist to discuss your cardiomegaly. Use the [employer] as needed for your cough. Continue using over-the-counter symptomatic care. Follow-up with your PCP if symptoms do not improve in the next 1-2 weeks. Return to the emergency department with new, worsening or concerning symptoms.   Cough, Adult Coughing is a reflex that clears your throat and your airways. Coughing helps to heal and protect your lungs. It is normal to cough occasionally, but a cough that happens with other symptoms or lasts a long time may be a sign of a condition that needs treatment. A cough may last only 2-3 weeks (acute), or it may last longer than 8 weeks (chronic). CAUSES Coughing is commonly caused by:  Breathing in substances that irritate your lungs.  A viral or bacterial respiratory infection.  Allergies.  Asthma.  Postnasal drip.  Smoking.  Acid backing up from the stomach into the esophagus (gastroesophageal reflux).  Certain medicines.  Chronic lung problems, including COPD (or rarely, lung cancer).  Other medical conditions such as heart failure. HOME CARE INSTRUCTIONS  Pay attention to any changes in your symptoms. Take these actions to help with your discomfort:  Take medicines only as told by your health care provider.  If you were prescribed an antibiotic medicine, take it as told by your health care provider. Do not stop taking the antibiotic even if you start to feel better.  Talk with your health care provider before you take a cough suppressant medicine.  Drink enough fluid to keep your urine clear or pale yellow.  If the air is dry, use a cold steam vaporizer or humidifier in your bedroom or your home to help loosen secretions.  Avoid anything that causes you to cough at work or at home.  If your cough is worse at night, try sleeping in a semi-upright position.  Avoid cigarette smoke. If you smoke, quit smoking. If you need help  quitting, ask your health care provider.  Avoid caffeine.  Avoid alcohol.  Rest as needed. SEEK MEDICAL CARE IF:   You have new symptoms.  You cough up pus.  Your cough does not get better after 2-3 weeks, or your cough gets worse.  You cannot control your cough with suppressant medicines and you are losing sleep.  You develop pain that is getting worse or pain that is not controlled with pain medicines.  You have a fever.  You have unexplained weight loss.  You have night sweats. SEEK IMMEDIATE MEDICAL CARE IF:  You cough up blood.  You have difficulty breathing.  Your heartbeat is very fast.   This information is not intended to replace advice given to you by your health care provider. Make sure you discuss any questions you have with your health care provider.   Document Released: 11/16/2010 Document Revised: 02/08/2015 Document Reviewed: 07/27/2014 Elsevier Interactive Patient Education [employer].

## 2016-07-25 ENCOUNTER — Other Ambulatory Visit: Payer: Self-pay | Admitting: Internal Medicine

## 2016-07-25 DIAGNOSIS — E049 Nontoxic goiter, unspecified: Secondary | ICD-10-CM

## 2016-08-12 ENCOUNTER — Other Ambulatory Visit: Payer: Federal, State, Local not specified - PPO

## 2016-08-20 ENCOUNTER — Ambulatory Visit
Admission: RE | Admit: 2016-08-20 | Discharge: 2016-08-20 | Disposition: A | Discharge status: Home / Self Care | Payer: Federal, State, Local not specified - PPO | Source: Ambulatory Visit | Attending: Internal Medicine | Admitting: Internal Medicine

## 2016-08-20 DIAGNOSIS — E049 Nontoxic goiter, unspecified: Secondary | ICD-10-CM

## 2016-09-03 ENCOUNTER — Other Ambulatory Visit: Payer: Self-pay | Admitting: Internal Medicine

## 2016-09-03 DIAGNOSIS — E041 Nontoxic single thyroid nodule: Secondary | ICD-10-CM

## 2016-09-10 ENCOUNTER — Ambulatory Visit (INDEPENDENT_AMBULATORY_CARE_PROVIDER_SITE_OTHER): Payer: Federal, State, Local not specified - PPO | Admitting: Family Medicine

## 2016-09-10 VITALS — BP 161/84 | HR 68 | Temp 98.1°F | Resp 16 | Ht 62.0 in | Wt 183.2 lb

## 2016-09-10 DIAGNOSIS — M62838 Other muscle spasm: Secondary | ICD-10-CM | POA: Diagnosis not present

## 2016-09-10 DIAGNOSIS — M549 Dorsalgia, unspecified: Secondary | ICD-10-CM | POA: Diagnosis not present

## 2016-09-10 MED ORDER — CYCLOBENZAPRINE HCL 5 MG PO TABS: ORAL_TABLET | ORAL | 0 refills | Status: DC

## 2016-09-10 NOTE — Patient Instructions (Addendum)
Back pain appears to be due to the muscle, or overuse of muscle on the day prior. I expect that to improve in the next few days, but you can use Tylenol over-the-counter, heat or ice to that area, gentle stretches and range of motion of the shoulder and back, and if needed I did prescribe Flexeril which is a muscle relaxant up to 3 times per day. Be cautious with that medicine as it does cause sedation or dizziness. If not improving in the next 1 week, or worsening sooner, return here or the emergency room.   Muscle Cramps and Spasms Muscle cramps and spasms occur when a muscle or muscles tighten and you have no control over this tightening (involuntary muscle contraction). They are a common problem and can develop in any muscle. The most common place is in the calf muscles of the leg. Muscle cramps and muscle spasms are both involuntary muscle contractions, but there are some differences between the two:  Muscle cramps are painful. They come and go and may last a few seconds to 15 minutes. Muscle cramps are often more forceful and last longer than muscle spasms.  Muscle spasms may or may not be painful. They may also last just a few seconds or much longer. Certain medical conditions, such as diabetes or Parkinson disease, can make it more likely to develop cramps or spasms. However, cramps or spasms are usually not caused by a serious underlying problem. Common causes include:  Overexertion.  Overuse from repetitive motions, or doing the same thing over and over.  Remaining in a certain position for a long period of time.  Improper preparation, form, or technique while playing a sport or doing an activity.  Dehydration.  Injury.  Side effects of some medicines.  Abnormally low levels of the salts and ions in your blood (electrolytes), especially potassium and calcium. This could happen if you are taking water pills (diuretics) or if you are pregnant. In many cases, the cause of muscle  cramps or spasms is unknown. Follow these instructions at home:  Stay well hydrated. Drink enough fluid to keep your urine clear or pale yellow.  Try massaging, stretching, and relaxing the affected muscle.  If directed, apply heat to tight or tense muscles as often as told by your health care provider. Use the heat source that your health care provider recommends, such as a moist heat pack or a heating pad.  Place a towel between your skin and the heat source.  Leave the heat on for 20-30 minutes.  Remove the heat if your skin turns bright red. This is especially important if you are unable to feel pain, heat, or cold. You may have a greater risk of getting burned.  If directed, put ice on the affected area. This may help if you are sore or have pain after a cramp or spasm.  Put ice in a plastic bag.  Place a towel between your skin and the bag.  Leavethe ice on for 20 minutes, 2-3 times a day.  Take over-the-counter and prescription medicines only as told by your health care provider.  Pay attention to any changes in your symptoms. Contact a health care provider if:  Your cramps or spasms get more severe or happen more often.  Your cramps or spasms do not improve over time. This information is not intended to replace advice given to you by your health care provider. Make sure you discuss any questions you have with your health care provider. Document  Released: 11/09/2001 Document Revised: 06/21/2015 Document Reviewed: 02/21/2015 Elsevier Interactive Patient Education  2017 [employer].   We recommend that you schedule a mammogram for breast cancer screening. Typically, you do not need a referral to do this. Please contact a local imaging center to schedule your mammogram.  [facility] - [phone]  *ask for the Radiology Department [facility]) - [phone] or [phone]  [facility] - [phone] [place] [phone] [facility] - [phone]  *ask for the [facility] - [phone]  *ask for the Radiology Department [facility] - [phone]  *ask for the [facility] - [phone]     IF you received an x-ray today, you will receive an invoice from [facility] Radiology. Please contact [facility] Radiology at [phone] with questions or concerns regarding your invoice.   IF you received labwork today, you will receive an invoice from [place]. Please contact [facility] at [phone] with questions or concerns regarding your invoice.   Our billing staff will not be able to assist you with questions regarding bills from these companies.  You will be contacted with the lab results as soon as they are available. The fastest way to get your results is to activate your My Chart account. Instructions are located on the last page of this paperwork. If you have not heard from [place] regarding the results in 2 weeks, please contact this office.

## 2016-09-10 NOTE — Progress Notes (Signed)
Subjective:  By signing my name below, I, Jean Krause, attest that this documentation has been prepared under the direction and in the presence of Wendie Agreste, MD Electronically Signed: Ladene Artist, ED Scribe 09/10/2016 at 11:26 AM.   Patient ID: Jean Krause, female    DOB: [birth year], 66 y.o.   MRN: [id number]  Chief Complaint  Patient presents with  . Back Pain    mid righ tback radiating to middle, started yesterday    HPI Jean Krause is a 66 y.o. female who presents to Primary Care at [facility] complaining of sudden onset of constant, mid right-sided back pain onset yesterday morning. Pt initially noticed back pain with leaning forward yesterday morning. Pt does recall that she had her arms raised the day prior to guard a mirror on a dresser while her children were moving the [employer]. She reports increased back pain with deep breathing. Pt has tried a heating pad without significant relief. She denies neck pain, sob, calf pain, leg swelling, recent travel. No h/o DVT/PE. No previous back pain.   Patient Active Problem List   Diagnosis Date Noted  . ALLERGIC RHINITIS 06/20/2010  . 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 ANEMIA, SECONDARY TO ACUTE BLOOD LOSS 10/16/2007  . ACID REFLUX DISEASE 10/16/2007  . ACUT GASTR ULCER W/O MENTION HEMORR PERF/OBST 10/16/2007  . DIVERTICULOSIS, COLON 10/16/2007  . TACHYCARDIA 10/16/2007   Past Medical History:  Diagnosis Date  . Blood transfusion   . Diverticulosis   . Fibroids   . GERD (gastroesophageal reflux disease)   . Headache   . Internal hemorrhoids   . Stomach ulcer    2008  . Vitamin D deficiency    Past Surgical History:  Procedure Laterality Date  . MYOMECTOMY     with fibroid tumor excision  . POLYPECTOMY    . ROTATOR CUFF REPAIR     right  . UPPER GASTROINTESTINAL ENDOSCOPY     Allergies  Allergen Reactions  . Aspirin Other (See Comments)    Upset stomach and bleeding  . Bactrim Nausea Only  .  Diphenhydramine Hcl     REACTION: Hives  . Shellfish Allergy     REACTION: Hives   Prior to Admission medications   Medication Sig Start Date End Date Taking? Authorizing Provider  Barberry-Oreg Grape-Goldenseal (BERBERINE COMPLEX) 200-200-50 MG CAPS Take 1 capsule by mouth 3 (three) times daily.   Yes Historical Provider, MD  benzonatate (TESSALON) 100 MG capsule Take 1 capsule (100 mg total) by mouth every 8 (eight) hours. 08/08/15  Yes Stevi Barrett, PA-C  calcium carbonate 1250 MG capsule Take 1,250 mg by mouth daily.    Yes Historical Provider, MD  cholecalciferol (VITAMIN D) 1000 UNITS tablet Take 5,000 Units by mouth daily.    Yes Historical Provider, MD  CINNAMON PO Take 1 capsule by mouth daily.    Yes Historical Provider, MD  OVER THE COUNTER MEDICATION Take 1 Package by mouth daily. Multivitamin pack   Yes Historical Provider, MD  doxycycline (VIBRA-TABS) 100 MG tablet Take 1 tablet (100 mg total) by mouth 2 (two) times daily. Patient not taking: Reported on 09/10/2016 07/25/15   Mancel Bale, PA-C  HYDROcodone-homatropine [facility]) 5-1.5 MG/5ML syrup Take 5 mLs by mouth every 8 (eight) hours as needed for cough. Patient not taking: Reported on 09/10/2016 07/25/15   Mancel Bale, PA-C   Social History   Social History  . Marital status: Single    Spouse name: N/A  . Number of  children: N/A  . Years of education: N/A   Occupational History  . Not on file.   Social History Main Topics  . Smoking status: Former Smoker    Quit date: 06/07/1978  . Smokeless tobacco: Never Used  . Alcohol use No  . Drug use: No  . Sexual activity: Yes    Birth control/ protection: Post-menopausal   Other Topics Concern  . Not on file   Social History Narrative  . No narrative on file   Review of Systems  Respiratory: Negative for shortness of breath.   Cardiovascular: Negative for leg swelling.  Musculoskeletal: Positive for back pain. Negative for neck pain.      Objective:    Physical Exam  Constitutional: She is oriented to person, place, and time. She appears well-developed and well-nourished. No distress.  HENT:  Head: Normocephalic and atraumatic.  Eyes: Conjunctivae and EOM are normal.  Neck: Neck supple. No tracheal deviation present.  Cardiovascular: Normal rate.   Pulmonary/Chest: Effort normal and breath sounds normal. No respiratory distress.  Equal breath sounds bilaterally   Musculoskeletal: Normal range of motion.  Tender along R rhomboid. No midline thoracic pain. Slight discomfort with R arm abduction. FROM. Equal strength in UE. Normal ROM of cervical spine but slight discomfort in R back area with L neck rotation.   Neurological: She is alert and oriented to person, place, and time.  Skin: Skin is warm and dry.  Skin is intact. No rash or vesicles. No pain or burning of skin.  Psychiatric: She has a normal mood and affect. Her behavior is normal.  Nursing note and vitals reviewed.  Vitals:   09/10/16 1106  BP: (!) 161/84  Pulse: 68  Resp: 16  Temp: 98.1 F (36.7 C)  TempSrc: Oral  SpO2: 100%  Weight: 183 lb 3.2 oz (83.1 kg)  Height: 5\' 2"  (1.575 m)      Assessment & Plan:    Jean Krause is a 66 y.o. female Upper back pain on right side - Plan: cyclobenzaprine (FLEXERIL) 5 MG tablet  Muscle spasm - Plan: cyclobenzaprine (FLEXERIL) 5 MG tablet  Suspected overuse with furniture movement day prior, now with spasm of trapezius versus rhomboid on right. No bony tenderness, no fall or known injury. X-ray deferred at this time.  -Symptomatic care discussed with heat or ice, gentle range of motion and stretching, over-the-counter tylenol, and Flexeril up to 3 times a day when necessary. RTC precautions if pain is not improving within a week, or if worsening sooner.   Meds ordered this encounter  Medications  . cyclobenzaprine (FLEXERIL) 5 MG tablet    Sig: 1 pill by mouth up to every 8 hours as needed. Start with one pill by mouth  each bedtime as needed due to sedation    Dispense:  15 tablet    Refill:  0   Patient Instructions   Back pain appears to be due to the muscle, or overuse of muscle on the day prior. I expect that to improve in the next few days, but you can use Tylenol over-the-counter, heat or ice to that area, gentle stretches and range of motion of the shoulder and back, and if needed I did prescribe Flexeril which is a muscle relaxant up to 3 times per day. Be cautious with that medicine as it does cause sedation or dizziness. If not improving in the next 1 week, or worsening sooner, return here or the emergency room.   Muscle Cramps and Spasms Muscle  cramps and spasms occur when a muscle or muscles tighten and you have no control over this tightening (involuntary muscle contraction). They are a common problem and can develop in any muscle. The most common place is in the calf muscles of the leg. Muscle cramps and muscle spasms are both involuntary muscle contractions, but there are some differences between the two:  Muscle cramps are painful. They come and go and may last a few seconds to 15 minutes. Muscle cramps are often more forceful and last longer than muscle spasms.  Muscle spasms may or may not be painful. They may also last just a few seconds or much longer. Certain medical conditions, such as diabetes or Parkinson disease, can make it more likely to develop cramps or spasms. However, cramps or spasms are usually not caused by a serious underlying problem. Common causes include:  Overexertion.  Overuse from repetitive motions, or doing the same thing over and over.  Remaining in a certain position for a long period of time.  Improper preparation, form, or technique while playing a sport or doing an activity.  Dehydration.  Injury.  Side effects of some medicines.  Abnormally low levels of the salts and ions in your blood (electrolytes), especially potassium and calcium. This could  happen if you are taking water pills (diuretics) or if you are pregnant. In many cases, the cause of muscle cramps or spasms is unknown. Follow these instructions at home:  Stay well hydrated. Drink enough fluid to keep your urine clear or pale yellow.  Try massaging, stretching, and relaxing the affected muscle.  If directed, apply heat to tight or tense muscles as often as told by your health care provider. Use the heat source that your health care provider recommends, such as a moist heat pack or a heating pad.  Place a towel between your skin and the heat source.  Leave the heat on for 20-30 minutes.  Remove the heat if your skin turns bright red. This is especially important if you are unable to feel pain, heat, or cold. You may have a greater risk of getting burned.  If directed, put ice on the affected area. This may help if you are sore or have pain after a cramp or spasm.  Put ice in a plastic bag.  Place a towel between your skin and the bag.  Leavethe ice on for 20 minutes, 2-3 times a day.  Take over-the-counter and prescription medicines only as told by your health care provider.  Pay attention to any changes in your symptoms. Contact a health care provider if:  Your cramps or spasms get more severe or happen more often.  Your cramps or spasms do not improve over time. This information is not intended to replace advice given to you by your health care provider. Make sure you discuss any questions you have with your health care provider. Document Released: 11/09/2001 Document Revised: 06/21/2015 Document Reviewed: 02/21/2015 Elsevier Interactive Patient Education  2017 [employer].   We recommend that you schedule a mammogram for breast cancer screening. Typically, you do not need a referral to do this. Please contact a local imaging center to schedule your mammogram.  [facility] - [phone]  *ask for the Radiology Department [facility]  ([place]) - [phone] or [phone]  [facility] - [phone] [place] [phone] [facility] - [phone]  *ask for the [facility] - [phone]  *  ask for the Radiology Department [facility] - [phone]  *ask for the [place] - [phone]     IF you received an x-ray today, you will receive an invoice from [facility] Radiology. Please contact [facility] Radiology at [phone] with questions or concerns regarding your invoice.   IF you received labwork today, you will receive an invoice from [facility]. Please contact [facility] at [phone] with questions or concerns regarding your invoice.   Our billing staff will not be able to assist you with questions regarding bills from these companies.  You will be contacted with the lab results as soon as they are available. The fastest way to get your results is to activate your My Chart account. Instructions are located on the last page of this paperwork. If you have not heard from [place] regarding the results in 2 weeks, please contact this office.       I personally performed the services described in this documentation, which was scribed in my presence. The recorded information has been reviewed and considered for accuracy and completeness, addended by me as needed, and agree with information above.  Signed,   Merri Ray, MD Primary Care at [facility].  09/10/16 11:49 AM

## 2016-09-12 ENCOUNTER — Other Ambulatory Visit: Payer: Federal, State, Local not specified - PPO

## 2016-09-25 ENCOUNTER — Inpatient Hospital Stay: Admission: RE | Admit: 2016-09-25 | Payer: Federal, State, Local not specified - PPO | Source: Ambulatory Visit

## 2016-10-02 ENCOUNTER — Other Ambulatory Visit: Payer: Self-pay | Admitting: Internal Medicine

## 2016-10-02 DIAGNOSIS — E041 Nontoxic single thyroid nodule: Secondary | ICD-10-CM

## 2016-10-09 ENCOUNTER — Other Ambulatory Visit: Payer: Self-pay | Admitting: Internal Medicine

## 2016-10-09 ENCOUNTER — Ambulatory Visit
Admission: RE | Admit: 2016-10-09 | Discharge: 2016-10-09 | Disposition: A | Discharge status: Home / Self Care | Payer: Federal, State, Local not specified - PPO | Source: Ambulatory Visit | Attending: Internal Medicine | Admitting: Internal Medicine

## 2016-10-09 DIAGNOSIS — E041 Nontoxic single thyroid nodule: Secondary | ICD-10-CM

## 2017-03-18 LAB — IFOBT (OCCULT BLOOD): IFOBT: NEGATIVE

## 2017-04-15 ENCOUNTER — Other Ambulatory Visit: Payer: Self-pay | Admitting: Internal Medicine

## 2017-04-15 DIAGNOSIS — E041 Nontoxic single thyroid nodule: Secondary | ICD-10-CM

## 2017-05-05 ENCOUNTER — Other Ambulatory Visit: Payer: Federal, State, Local not specified - PPO

## 2017-05-07 ENCOUNTER — Other Ambulatory Visit: Payer: Self-pay | Admitting: Internal Medicine

## 2017-05-07 DIAGNOSIS — E041 Nontoxic single thyroid nodule: Secondary | ICD-10-CM

## 2017-05-13 ENCOUNTER — Other Ambulatory Visit: Payer: Federal, State, Local not specified - PPO

## 2017-05-19 ENCOUNTER — Ambulatory Visit
Admission: RE | Admit: 2017-05-19 | Discharge: 2017-05-19 | Disposition: A | Discharge status: Home / Self Care | Payer: Federal, State, Local not specified - PPO | Source: Ambulatory Visit | Attending: Internal Medicine | Admitting: Internal Medicine

## 2017-05-19 DIAGNOSIS — E041 Nontoxic single thyroid nodule: Secondary | ICD-10-CM

## 2017-08-04 ENCOUNTER — Encounter: Payer: Self-pay | Admitting: Endocrinology

## 2017-08-04 ENCOUNTER — Ambulatory Visit (INDEPENDENT_AMBULATORY_CARE_PROVIDER_SITE_OTHER): Payer: Federal, State, Local not specified - PPO | Admitting: Endocrinology

## 2017-08-04 DIAGNOSIS — E042 Nontoxic multinodular goiter: Secondary | ICD-10-CM

## 2017-08-04 NOTE — Progress Notes (Signed)
Subjective:    Patient ID: Jean Krause, female    DOB: November 15, 1950, 67 y.o.   MRN: [id number]  HPI Pt is referred by Dr Coralyn Mark, for nodular thyroid.  Pt was noted to have a nodule at the thyroid in 2018, incidentally on routine phys exam.  bx was ordered, but pt declined.  she has no h/o XRT or surgery to the neck.  She has slight weight gain, but no assoc neck pain Past Medical History:  Diagnosis Date  . Blood transfusion   . Diverticulosis   . Fibroids   . GERD (gastroesophageal reflux disease)   . Headache   . Internal hemorrhoids   . Stomach ulcer    2008  . Vitamin D deficiency     Past Surgical History:  Procedure Laterality Date  . MYOMECTOMY     with fibroid tumor excision  . POLYPECTOMY    . ROTATOR CUFF REPAIR     right  . UPPER GASTROINTESTINAL ENDOSCOPY      Social History   Socioeconomic History  . Marital status: Single    Spouse name: Not on file  . Number of children: Not on file  . Years of education: Not on file  . Highest education level: Not on file  Social Needs  . Financial resource strain: Not on file  . Food insecurity - worry: Not on file  . Food insecurity - inability: Not on file  . Transportation needs - medical: Not on file  . Transportation needs - non-medical: Not on file  Occupational History  . Not on file  Tobacco Use  . Smoking status: Former Smoker    Last attempt to quit: 06/07/1978    Years since quitting: 39.1  . Smokeless tobacco: Never Used  Substance and Sexual Activity  . Alcohol use: No    Alcohol/week: 0.0 oz  . Drug use: No  . Sexual activity: Yes    Birth control/protection: Post-menopausal  Other Topics Concern  . Not on file  Social History Narrative  . Not on file    Current Outpatient Medications on File Prior to Visit  Medication Sig Dispense Refill  . calcium carbonate 1250 MG capsule Take 1,250 mg by mouth daily.     . cholecalciferol (VITAMIN D) 1000 UNITS tablet Take 5,000 Units by mouth  daily.     Marland Kitchen CINNAMON PO Take 1 capsule by mouth daily.     Marland Kitchen OVER THE COUNTER MEDICATION Take 1 Package by mouth daily. Multivitamin pack     No current facility-administered medications on file prior to visit.     Allergies  Allergen Reactions  . Aspirin Other (See Comments)    Upset stomach and bleeding  . Bactrim Nausea Only  . Diphenhydramine Hcl     REACTION: Hives  . Shellfish Allergy     REACTION: Hives    Family History  Problem Relation Age of Onset  . Arthritis Mother   . Heart disease Unknown   . Stroke Unknown   . Diabetes Unknown   . Thyroid disease Neg Hx     BP 132/84 (BP Location: Left Arm, Patient Position: Sitting, Cuff Size: Normal)   Pulse 79   Temp 98.6 F (37 C) (Oral)   Ht 5\' 2"  (1.575 m)   Wt 188 lb 12.8 oz (85.6 kg)   SpO2 99%   BMI 34.53 kg/m     Review of Systems Denies fatigue, hoarseness, visual loss, chest pain, sob, cough, dysphagia, diarrhea, itching, flushing, easy  bruising, depression, cold intolerance, headache, numbness, and rhinorrhea.      Objective:   Physical Exam VS: see vs page GEN: no distress HEAD: head: no deformity eyes: no periorbital swelling, no proptosis external nose and ears are normal mouth: no lesion seen NECK: 1-2 cm left thyroid nodule is palpable.  CHEST WALL: no deformity LUNGS: clear to auscultation CV: reg rate and rhythm, no murmur ABD: abdomen is soft, nontender.  no hepatosplenomegaly.  not distended.  no hernia MUSCULOSKELETAL: muscle bulk and strength are grossly normal.  no obvious joint swelling.  gait is normal and steady EXTEMITIES: no deformity.  no edema PULSES: no carotid bruit NEURO:  cn 2-12 grossly intact.   readily moves all 4's.  sensation is intact to touch on all 4's SKIN:  Normal texture and temperature.  No rash or suspicious lesion is visible.   NODES:  None palpable at the neck PSYCH: alert, well-oriented.  Does not appear anxious nor depressed.  [place]: Thyromegaly with  findings suggestive of multinodular goiter. 2. The dominant approximately 2.6 cm nodule/pseudo nodule (nodule #4) within the inferior aspect the left lobe of the thyroid meets imaging criteria to recommend percutaneous sampling as clinically Indicated.  3. The remaining thyroid nodules do not meet imaging criteria to  recommend percutaneous sampling or dedicated follow-up.  Nodule #3 is part of the previously described pseudo nodule. Not clear if this represents a true nodule. Assuming this is a true nodule, this nodule meets criteria for 1 year follow-up.   I have reviewed outside records, and summarized: Pt was noted to have multinodular goiter, and referred here.  Pt was noted to be asymptomatic.  outside test results are reviewed: TSH=0.81     Assessment & Plan:  Thyroid nodule or pseudonodule, new to me.     Patient Instructions  No further thyroid testing or treatment is needed now. Please come back for a follow-up appointment in 1 year.

## 2017-08-04 NOTE — Patient Instructions (Signed)
No further thyroid testing or treatment is needed now.   Please come back for a follow-up appointment in 1 year.   

## 2017-08-07 DIAGNOSIS — E042 Nontoxic multinodular goiter: Secondary | ICD-10-CM | POA: Insufficient documentation

## 2017-10-06 ENCOUNTER — Encounter: Payer: Self-pay | Admitting: Neurology

## 2017-12-02 ENCOUNTER — Ambulatory Visit: Payer: Federal, State, Local not specified - PPO | Admitting: Neurology

## 2018-06-09 IMAGING — US US THYROID
1 series · 12 of 25 positions shown · non-contrast
Comparison: 08/20/2016 and 10/09/2016

CLINICAL DATA: Evaluate thyroid nodules.

EXAM:
THYROID ULTRASOUND
TECHNIQUE: Ultrasound examination of the thyroid gland and adjacent soft
tissues was performed.

[Series 1: us thyroid · 0.10mm/px · 69 acquisitions, 12 frames shown]
[im 3/69]
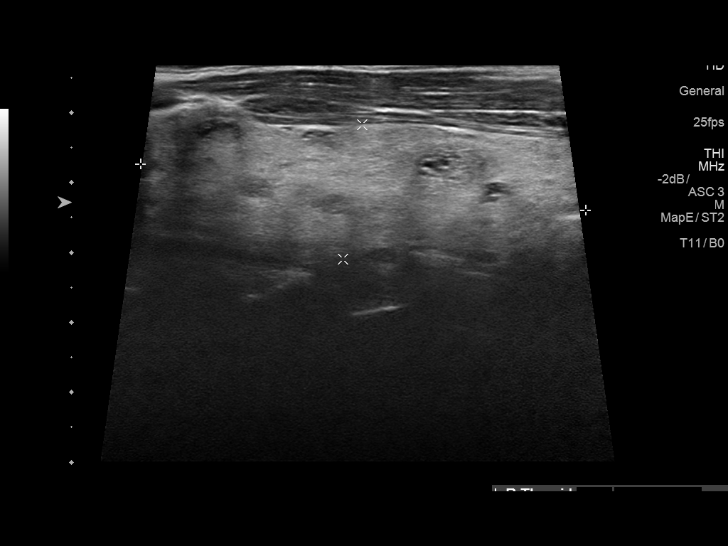
[im 9/69]
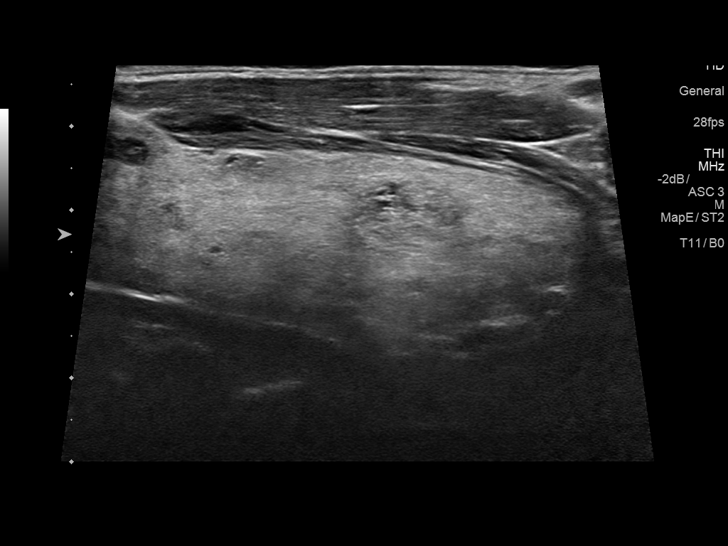
[im 15/69]
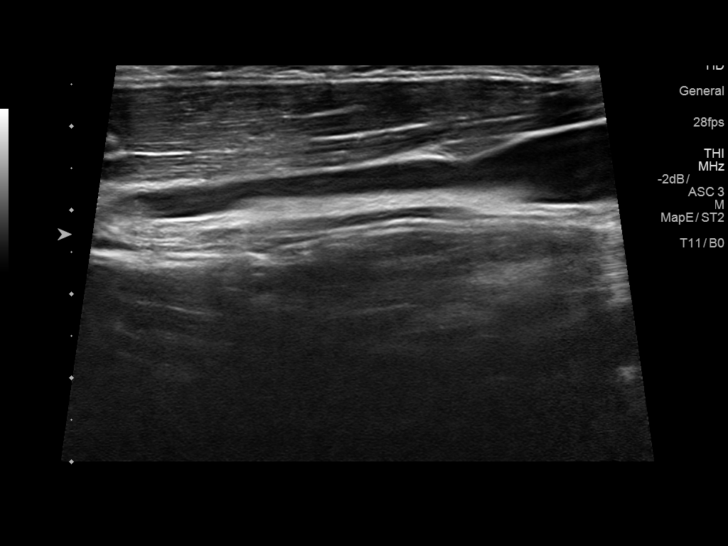
[im 20/69]
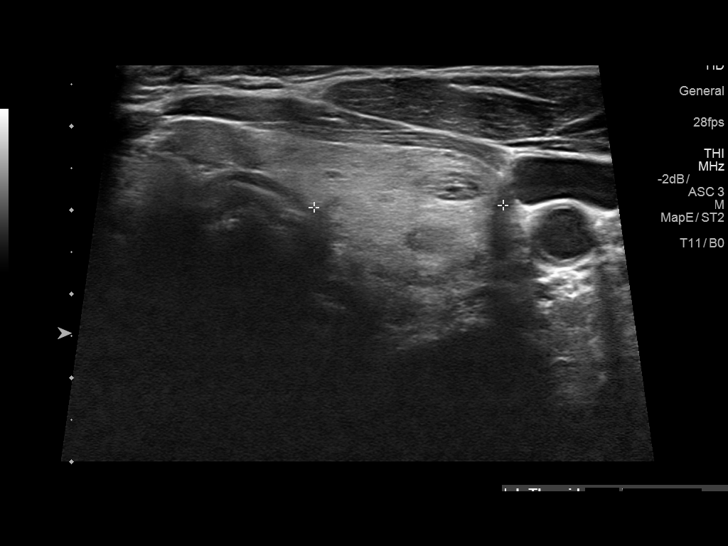
[im 26/69]
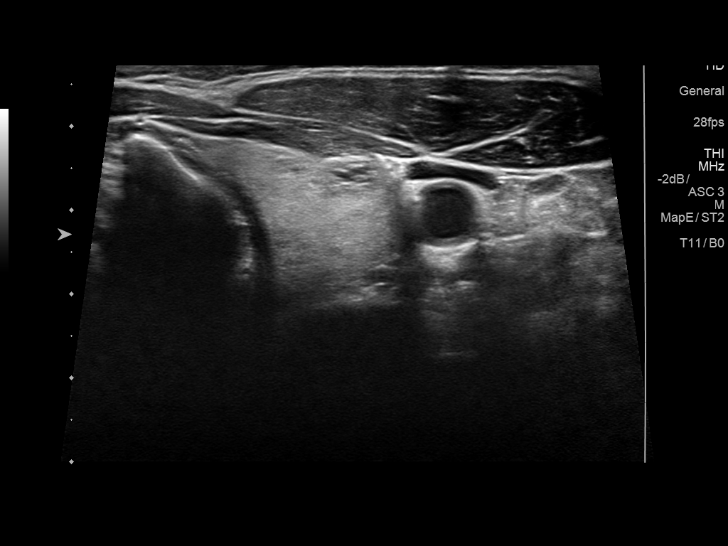
[im 32/69]
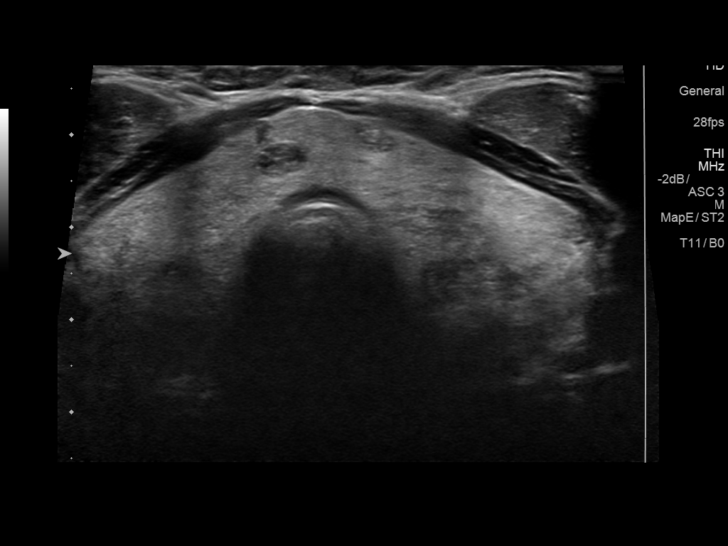
[im 37/69]
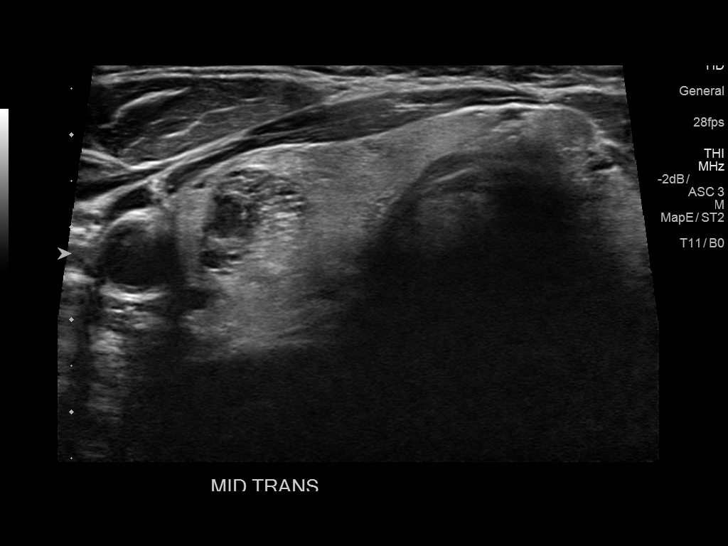
[im 43/69]
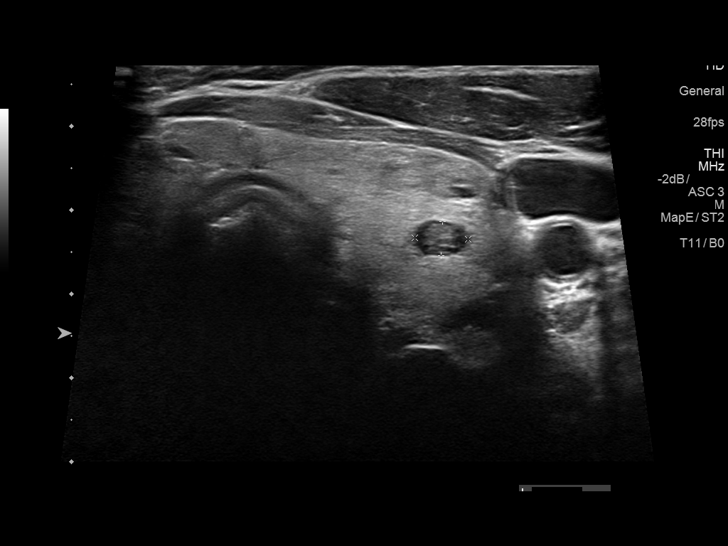
[im 49/69]
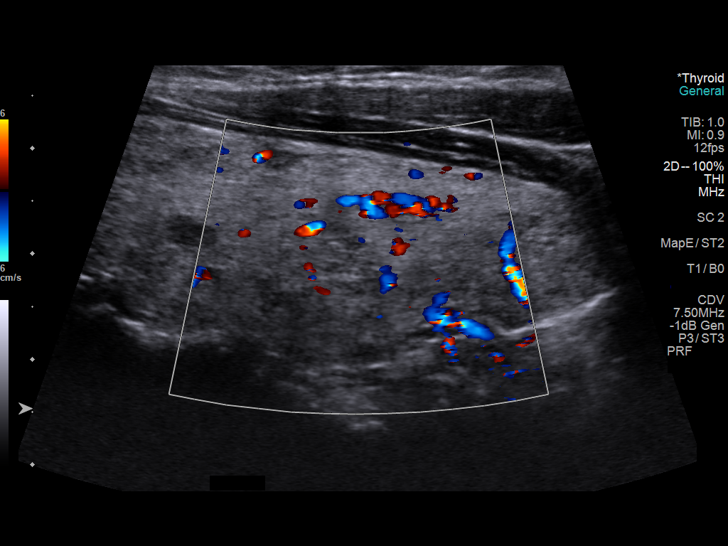
[im 54/69]
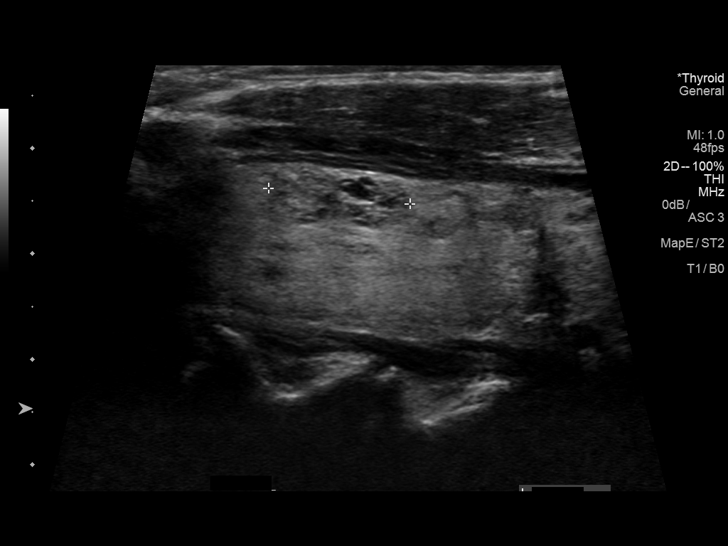
[im 60/69]
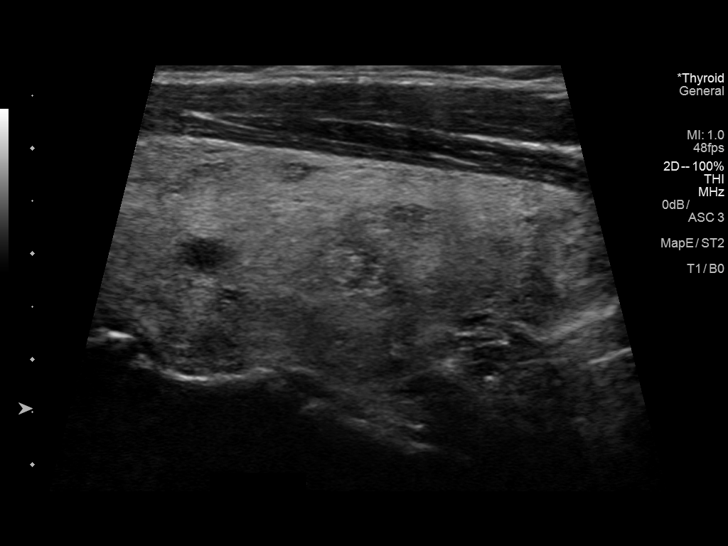
[im 66/69]
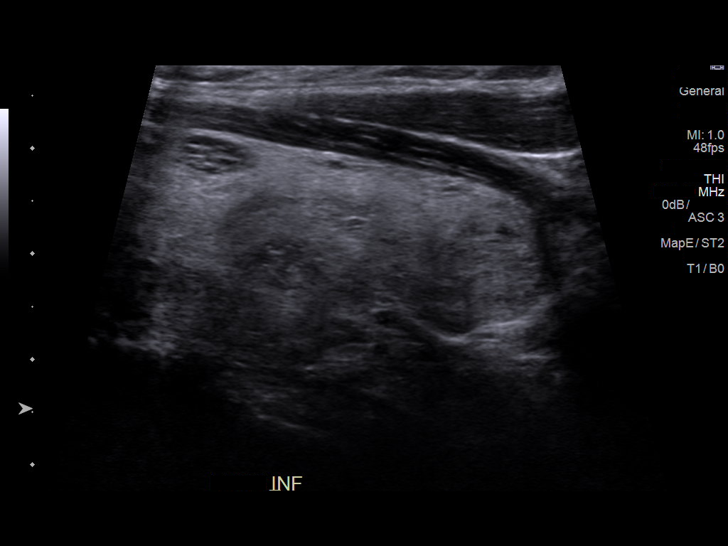

[12 of 25 positions shown; findings below may reference images not displayed]

FINDINGS: Parenchymal Echotexture: Moderately heterogenous

Isthmus: 1.0 cm, previously 0.7 cm

Right lobe: 6.4 x 1.9 x 2.4 cm, previously 6.3 x 2.1 x 2.9 cm

Left lobe: 6.6 x 2.4 x 2.3 cm, previously 5.7 x 2.4 x 2.5 cm

_________________________________________________________

Estimated total number of nodules >/= 1 cm: 3

Number of spongiform nodules >/=  2 cm not described below (TR1): 0

Number of mixed cystic and solid nodules >/= 1.5 cm not described
below (TR2): 0

_________________________________________________________

Nodule # 1:

Prior biopsy: No

Location: Right; Mid

Maximum size: 1.4 cm; Other 2 dimensions: 1.1 x 1.1 cm, previously,
1.4 x 1.0 x 1.1 cm

Composition: cannot determine (2)

Echogenicity: isoechoic (1)

Shape: not taller-than-wide (0)

Margins: ill-defined (0)

Echogenic foci: none (0)

ACR TI-RADS total points: 3.

ACR TI-RADS risk category:  TR3 (3 points).

Significant change in size (>/= 20% in two dimensions and minimal
increase of 2 mm): No

Change in features: No

Change in ACR TI-RADS risk category: No

ACR TI-RADS recommendations:

Given size (<1.4 cm) and appearance, this nodule does NOT meet
TI-RADS criteria for biopsy or dedicated follow-up.

_________________________________________________________

Nodule # 2:  This could represent a conglomeration of 2 nodules.

Prior biopsy: No

Location: Left; Superior

Maximum size: 1.4 cm; Other 2 dimensions: 0.5 x 0.8 cm, previously,
0.6 x 0.8 cm

Composition: cannot determine (2)

Echogenicity: isoechoic (1)

Shape: not taller-than-wide (0)

Margins: ill-defined (0)

Echogenic foci: none (0)

ACR TI-RADS total points: 3.

ACR TI-RADS risk category:  TR3 (3 points).

Significant change in size (>/= 20% in two dimensions and minimal
increase of 2 mm): No (measured differently on previous examination)

Change in features: No

Change in ACR TI-RADS risk category: No

ACR TI-RADS recommendations:

Given size (<1.4 cm) and appearance, this nodule does NOT meet
TI-RADS criteria for biopsy or dedicated follow-up.

_________________________________________________________

Nodule # 3:

Location: Left; Inferior

Maximum size: 1.1 cm; Other 2 dimensions: 1.0 x 1.4 cm

Composition: solid/almost completely solid (2)

Echogenicity: hypoechoic (2)

Shape: not taller-than-wide (0)

Margins: ill-defined (0)

Echogenic foci: none (0)

ACR TI-RADS total points: 4.

ACR TI-RADS risk category: TR4 (4-6 points).

ACR TI-RADS recommendations:

*Given size (>/= 1 - 1.4 cm) and appearance, a follow-up ultrasound
in 1 year should be considered based on TI-RADS criteria.

_________________________________________________________

Nodule #3 is part of the previously described pseudo nodule.
IMPRESSION: Multinodular goiter.

Nodule #3 is part of the previously described pseudo nodule. Not
clear if this represents a true nodule. Assuming this is a true
nodule, this nodule meets criteria for 1 year follow-up.

The above is in keeping with the ACR TI-RADS recommendations - [HOSPITAL] [id number];[DATE].

## 2018-06-24 ENCOUNTER — Ambulatory Visit
Admission: RE | Admit: 2018-06-24 | Discharge: 2018-06-24 | Disposition: A | Discharge status: Home / Self Care | Payer: Federal, State, Local not specified - PPO | Source: Ambulatory Visit | Attending: Internal Medicine | Admitting: Internal Medicine

## 2018-06-24 ENCOUNTER — Other Ambulatory Visit: Payer: Self-pay | Admitting: Internal Medicine

## 2018-06-24 DIAGNOSIS — M25561 Pain in right knee: Secondary | ICD-10-CM

## 2018-08-05 ENCOUNTER — Encounter: Payer: Self-pay | Admitting: Endocrinology

## 2018-08-05 ENCOUNTER — Ambulatory Visit: Payer: Federal, State, Local not specified - PPO | Admitting: Endocrinology

## 2018-08-05 ENCOUNTER — Telehealth: Payer: Self-pay | Admitting: Endocrinology

## 2018-08-05 NOTE — Telephone Encounter (Signed)
Patient no showed today's appt. Please advise on how to follow up. °A. No follow up necessary. °B. Follow up urgent. Contact patient immediately. °C. Follow up necessary. Contact patient and schedule visit in ___ days. °D. Follow up advised. Contact patient and schedule visit in ____weeks. ° °Would you like the NS fee to be applied to this visit? ° °

## 2018-08-05 NOTE — Telephone Encounter (Signed)
Please schedule f/u appt for next available appointment  

## 2018-08-06 NOTE — Telephone Encounter (Signed)
Please refer to Dr. Ellison's response 

## 2018-08-06 NOTE — Telephone Encounter (Signed)
LMTCB to reschedule missed appointment °

## 2018-11-13 ENCOUNTER — Ambulatory Visit: Payer: Federal, State, Local not specified - PPO | Admitting: Endocrinology

## 2018-12-01 ENCOUNTER — Ambulatory Visit: Payer: Federal, State, Local not specified - PPO | Admitting: Endocrinology

## 2019-05-28 IMAGING — CR DG KNEE 1-2V*R*
2 series · 2 of 2 positions shown · non-contrast
Comparison: None.

CLINICAL DATA: Medial knee pain, no known injury, initial encounter

EXAM:
RIGHT KNEE - 2 VIEW

[t knee ap right]
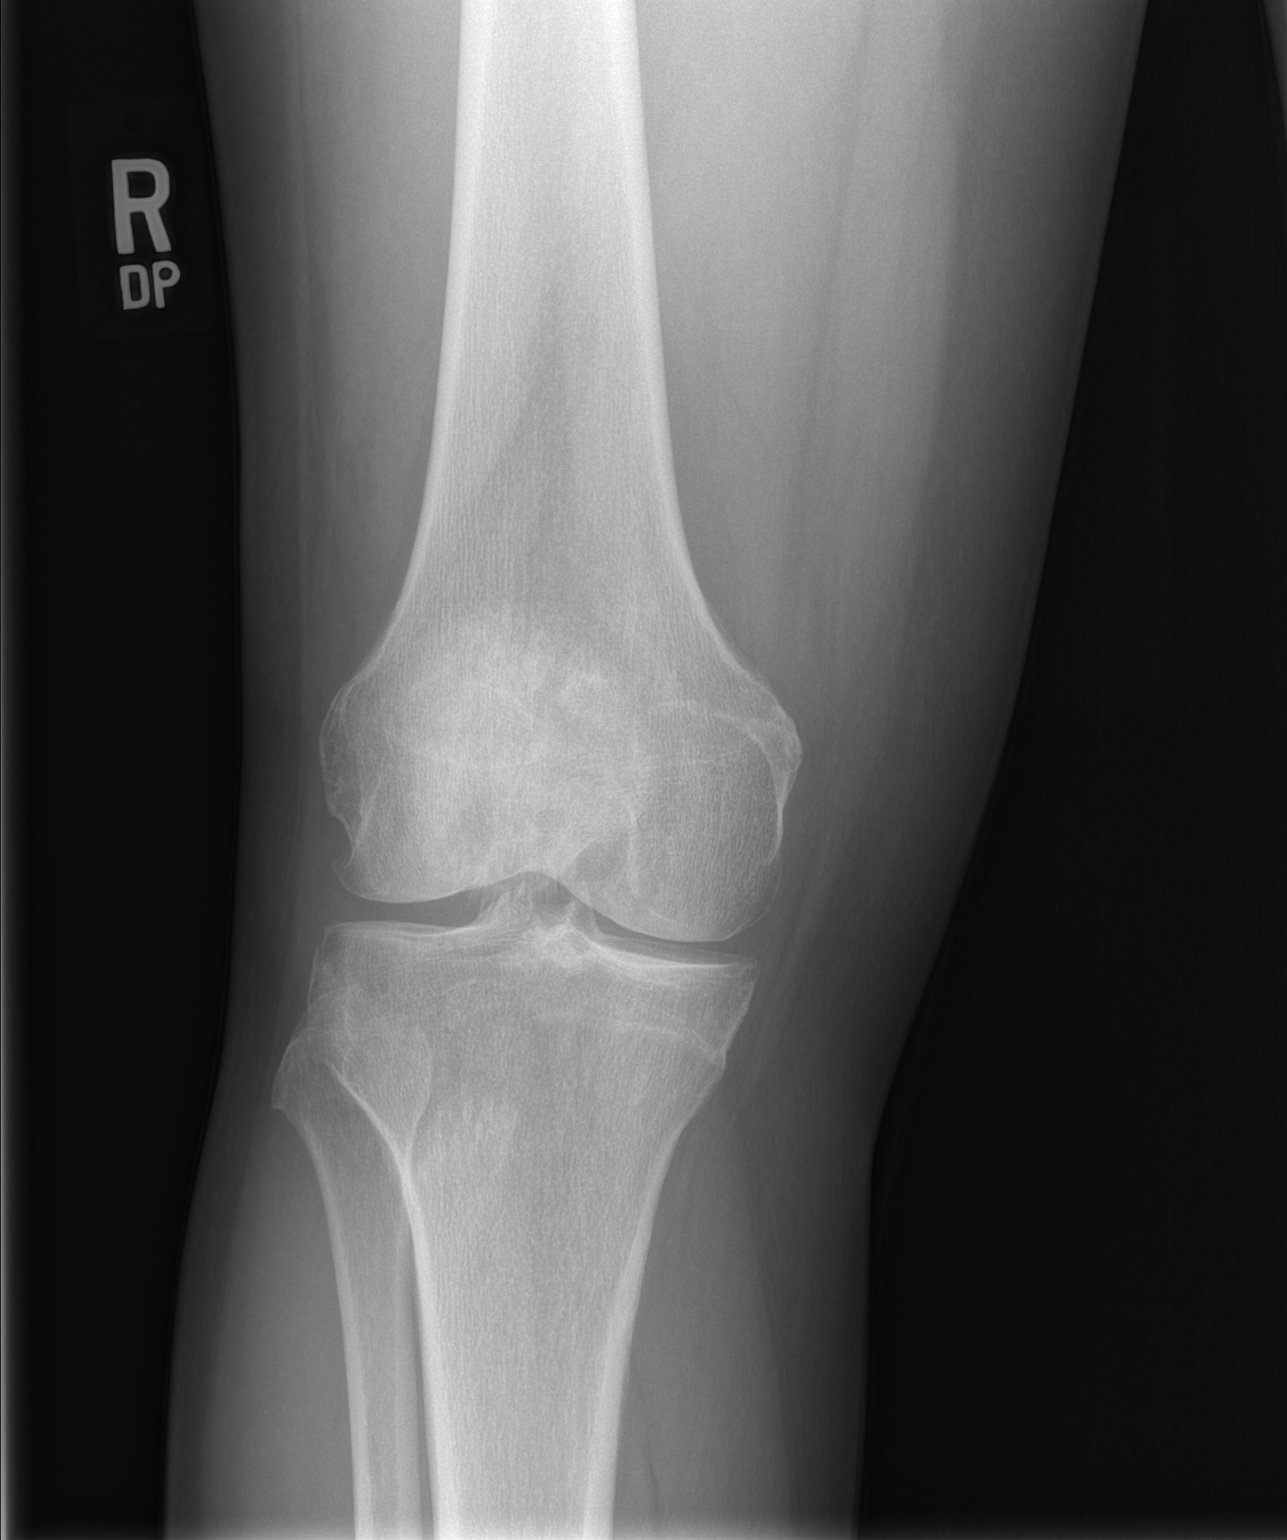

[t knee lat right]
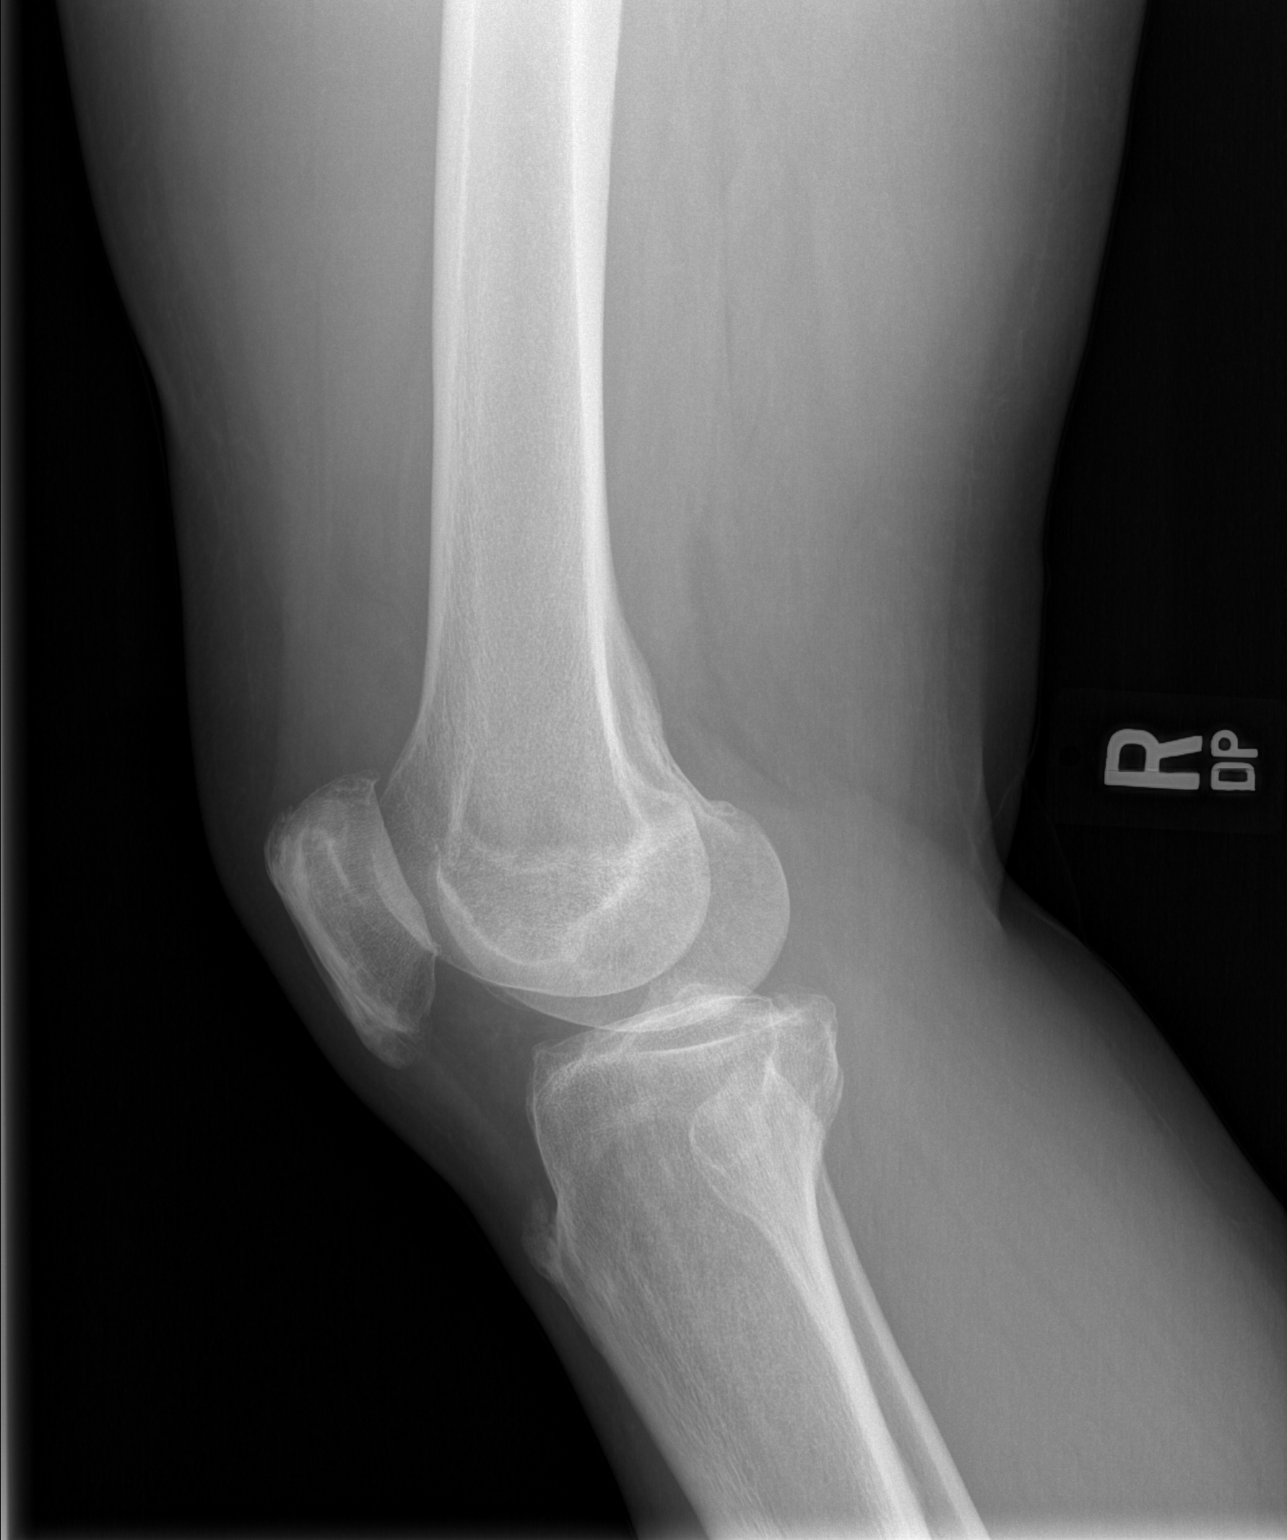

[2 of 2 positions shown; findings below may reference images not displayed]

FINDINGS: Mild tricompartmental degenerative changes are noted. No acute
fracture or dislocation is noted. No soft tissue abnormality is
noted.
IMPRESSION: Degenerative changes without acute abnormality.

## 2021-03-22 ENCOUNTER — Encounter: Payer: Self-pay | Admitting: Gastroenterology
